# Patient Record
Sex: Male | Born: 1955 | Race: Black or African American | Hispanic: No | Marital: Married | Smoking: Never smoker
Health system: Southern US, Community
[De-identification: ages and names within clinical notes are randomized; demographics above are authoritative.]

## PROBLEM LIST (undated history)

## (undated) DIAGNOSIS — I10 Essential (primary) hypertension: Secondary | ICD-10-CM

## (undated) DIAGNOSIS — R972 Elevated prostate specific antigen [PSA]: Secondary | ICD-10-CM

## (undated) DIAGNOSIS — K219 Gastro-esophageal reflux disease without esophagitis: Secondary | ICD-10-CM

## (undated) DIAGNOSIS — E7800 Pure hypercholesterolemia, unspecified: Secondary | ICD-10-CM

## (undated) HISTORY — PX: BACK SURGERY: SHX140

## (undated) HISTORY — PX: APPENDECTOMY: SHX54

---

## 2011-10-17 ENCOUNTER — Emergency Department (HOSPITAL_COMMUNITY)
Admission: EM | Admit: 2011-10-17 | Discharge: 2011-10-17 | Disposition: A | Discharge status: Home / Self Care | Payer: Managed Care, Other (non HMO) | Attending: Emergency Medicine | Admitting: Emergency Medicine

## 2011-10-17 ENCOUNTER — Emergency Department (HOSPITAL_COMMUNITY): Payer: Managed Care, Other (non HMO)

## 2011-10-17 ENCOUNTER — Encounter: Payer: Self-pay | Admitting: Emergency Medicine

## 2011-10-17 DIAGNOSIS — R791 Abnormal coagulation profile: Secondary | ICD-10-CM | POA: Insufficient documentation

## 2011-10-17 DIAGNOSIS — E7800 Pure hypercholesterolemia, unspecified: Secondary | ICD-10-CM | POA: Insufficient documentation

## 2011-10-17 DIAGNOSIS — R531 Weakness: Secondary | ICD-10-CM

## 2011-10-17 DIAGNOSIS — R0602 Shortness of breath: Secondary | ICD-10-CM | POA: Insufficient documentation

## 2011-10-17 DIAGNOSIS — R059 Cough, unspecified: Secondary | ICD-10-CM | POA: Insufficient documentation

## 2011-10-17 DIAGNOSIS — R05 Cough: Secondary | ICD-10-CM | POA: Insufficient documentation

## 2011-10-17 DIAGNOSIS — I10 Essential (primary) hypertension: Secondary | ICD-10-CM | POA: Insufficient documentation

## 2011-10-17 DIAGNOSIS — R5381 Other malaise: Secondary | ICD-10-CM | POA: Insufficient documentation

## 2011-10-17 DIAGNOSIS — Z79899 Other long term (current) drug therapy: Secondary | ICD-10-CM | POA: Insufficient documentation

## 2011-10-17 DIAGNOSIS — R5383 Other fatigue: Secondary | ICD-10-CM | POA: Insufficient documentation

## 2011-10-17 HISTORY — DX: Essential (primary) hypertension: I10

## 2011-10-17 HISTORY — DX: Pure hypercholesterolemia, unspecified: E78.00

## 2011-10-17 LAB — COMPREHENSIVE METABOLIC PANEL
Albumin: 4 g/dL (ref 3.5–5.2)
BUN: 19 mg/dL (ref 6–23)
Calcium: 9.5 mg/dL (ref 8.4–10.5)
Chloride: 100 mEq/L (ref 96–112)
Creatinine, Ser: 1.2 mg/dL (ref 0.50–1.35)
GFR calc non Af Amer: 67 mL/min — ABNORMAL LOW (ref >90)
Total Bilirubin: 1 mg/dL (ref 0.3–1.2)

## 2011-10-17 LAB — CBC
HCT: 42.9 % (ref 39.0–52.0)
Hemoglobin: 14.6 g/dL (ref 13.0–17.0)
MCH: 30.1 pg (ref 26.0–34.0)
MCHC: 34 g/dL (ref 30.0–36.0)
MCV: 88.5 fL (ref 78.0–100.0)

## 2011-10-17 LAB — RAPID URINE DRUG SCREEN, HOSP PERFORMED
Cocaine: NOT DETECTED
Opiates: NOT DETECTED
Tetrahydrocannabinol: NOT DETECTED

## 2011-10-17 LAB — DIFFERENTIAL
Basophils Relative: 0 % (ref 0–1)
Eosinophils Absolute: 0.1 10*3/uL (ref 0.0–0.7)
Eosinophils Relative: 2 % (ref 0–5)
Monocytes Absolute: 0.4 10*3/uL (ref 0.1–1.0)
Monocytes Relative: 6 % (ref 3–12)
Neutro Abs: 4.7 10*3/uL (ref 1.7–7.7)

## 2011-10-17 LAB — URINALYSIS, ROUTINE W REFLEX MICROSCOPIC
Glucose, UA: NEGATIVE mg/dL
Leukocytes, UA: NEGATIVE
pH: 6.5 (ref 5.0–8.0)

## 2011-10-17 MED ORDER — IOHEXOL 300 MG/ML  SOLN
100.0000 mL | Freq: Once | INTRAMUSCULAR | Status: AC | PRN
  Administered 2011-10-17: 100 mL via INTRAVENOUS

## 2011-10-17 MED ORDER — SODIUM CHLORIDE 0.9 % IV SOLN
999.0000 mL | INTRAVENOUS | Status: DC
  Administered 2011-10-17: 999 mL via INTRAVENOUS

## 2011-10-17 NOTE — ED Notes (Signed)
ZOX:[id number]<AV> Expected date:10/17/11<BR> Expected time: 5:55 AM<BR> Means of arrival:Ambulance<BR> Comments:<BR> EMS general weakness

## 2011-10-17 NOTE — ED Provider Notes (Signed)
History     CSN: [id number] Arrival date & time: 10/17/2011  6:15 AM   First MD Initiated Contact with Patient 10/17/11 [phone]      Chief Complaint  Patient presents with  . Weakness    Weakness this am, slowly resolving    (Consider location/radiation/quality/duration/timing/severity/associated sxs/prior treatment) Patient is a 55 y.o. male presenting with weakness. The history is provided by the patient.  Weakness Primary symptoms do not include headaches, syncope, loss of consciousness, seizures, dizziness, visual change, fever, nausea or vomiting. Episode onset: Several days ago. The symptoms are unchanged. The neurological symptoms are diffuse.  Additional symptoms include weakness. Medical issues do not include seizures or cerebral vascular accident. Associated medical issues comments: He was recently diagnosed with renal failure, treated successfully with IV fluids at a hospital in [place].   This morning, he had a period of rapid heartbeat and in decreased sensation in his right leg. He then checked his blood pressure was 170/110. The right leg dysesthesia resolved spontaneously. No associated chest pain. He has no shortness of breath, while supine. His weaknesses nonspecific in nature and is characterized by a "tired feeling".  Past Medical History  Diagnosis Date  . Hypertension   . High cholesterol     Past Surgical History  Procedure Date  . Back surgery     two back surgeries to help with sciatica    No family history on file.  History  Substance Use Topics  . Smoking status: Not on file  . Smokeless tobacco: Not on file  . Alcohol Use:       Review of Systems  Constitutional: Negative for fever.  Cardiovascular: Negative for syncope.  Gastrointestinal: Negative for nausea and vomiting.  Neurological: Positive for weakness. Negative for dizziness, seizures, loss of consciousness and headaches.  All other systems reviewed and are negative.    Allergies    Review of patient's allergies indicates no known allergies.  Home Medications   Current Outpatient Rx  Name Route Sig Dispense Refill  . DEXTROMETHORPHAN POLISTIREX ER 30 MG/5ML PO LQCR Oral Take 60 mg by mouth as needed.      Marland Kitchen DOXAZOSIN MESYLATE 2 MG PO TABS Oral Take 2 mg by mouth daily.      Marland Kitchen METOPROLOL SUCCINATE ER 200 MG PO TB24 Oral Take 200 mg by mouth daily.      Marland Kitchen SIMVASTATIN 10 MG PO TABS Oral Take 10 mg by mouth at bedtime.      . TRIAMTERENE-HCTZ 75-50 MG PO TABS Oral Take 1 tablet by mouth daily.        BP 143/93  Pulse 71  Temp(Src) 98.7 F (37.1 C) (Oral)  Resp 20  SpO2 99%  Physical Exam  Constitutional: He is oriented to person, place, and time. He appears well-developed and well-nourished.  HENT:  Head: Normocephalic and atraumatic.  Eyes: Conjunctivae and EOM are normal. Pupils are equal, round, and reactive to light.  Neck: Normal range of motion. Neck supple.  Cardiovascular: Normal rate and regular rhythm.   Pulmonary/Chest: Effort normal and breath sounds normal.  Abdominal: Soft. Bowel sounds are normal.  Musculoskeletal: Normal range of motion.  Neurological: He is alert and oriented to person, place, and time. No cranial nerve deficit. He exhibits normal muscle tone. Coordination normal.       There is no altered light touch sensation.  Skin: Skin is warm and dry.  Psychiatric: His behavior is normal. Judgment and thought content normal.  He is anxious    ED Course  Procedures (including critical care time)  Labs Reviewed  COMPREHENSIVE METABOLIC PANEL - Abnormal; Notable for the following:    Glucose, Bld 138 (*)    GFR calc non Af Amer 67 (*)    GFR calc Af Amer 78 (*)    All other components within normal limits  D-DIMER, QUANTITATIVE - Abnormal; Notable for the following:    D-Dimer, Quant 0.87 (*)    All other components within normal limits  AMMONIA  CBC  DIFFERENTIAL  URINE RAPID DRUG SCREEN (HOSP PERFORMED)  ETHANOL   LACTIC ACID, PLASMA  URINALYSIS, ROUTINE W REFLEX MICROSCOPIC  URINE CULTURE   Dg Chest 2 View  10/17/2011  *RADIOLOGY REPORT*  Clinical Data:  congestion, weakness, cough  CHEST - 2 VIEW  Comparison: None.  Findings: Borderline cardiomegaly noted.  No acute infiltrate or pulmonary edema.  Mild degenerative changes thoracic spine. Minimal perihilar increased bronchial markings without focal consolidation.  IMPRESSION: No acute infiltrate or pulmonary edema.  Minimal perihilar increased bronchial markings without focal consolidation.  Mild degenerative changes thoracic spine.  Original Report Authenticated By: Natasha Mead, M.D.   Ct Angio Chest W/cm &/or Wo Cm  10/17/2011  *RADIOLOGY REPORT*  Clinical Data:  Shortness of breath, elevated D-dimer  CT ANGIOGRAPHY CHEST WITH CONTRAST  Technique:  Multidetector CT imaging of the chest was performed using the standard protocol during bolus administration of intravenous contrast.  Multiplanar CT image reconstructions including MIPs were obtained to evaluate the vascular anatomy.  Contrast: OMNIPAQUE IOHEXOL 300 MG/ML IV SOLN  Comparison:   Site of service.  Findings:  Sagittal images of the spine shows degenerative changes mid and lower thoracic spine with multilevel anterior spurring.  Sagittal view of the sternum is unremarkable.  Images of the thoracic inlet are unremarkable.  Central airways are patent.  No mediastinal hematoma or adenopathy.  Heart size is within normal limits.  No pericardial effusion is noted.  No pulmonary embolus is identified.  Images of the lung parenchyma shows no acute infiltrate or pleural effusion.  No pulmonary edema.  The visualized upper abdomen is unremarkable.  Question tiny hiatal hernia.  There is a nodular density in the left upper abdomen subdiaphragmatic measures 8 mm.  This may represent small accessory splenule or a lymph node.  Review of the MIP images confirms the above findings.  IMPRESSION:  1.  No pulmonary  embolus is noted. 2.  No acute infiltrate or pulmonary edema. 3.  No adenopathy. 4. There is 8 mm nodule in the left upper abdomen subdiaphragmatic may represent small accessory splenule or a lymph node.  Original Report Authenticated By: Natasha Mead, M.D.     1. Weakness       MDM  Nonspecific weakness, with negative evaluation in the ED. Doubt pneumonia, stroke, metabolic, or occult infection.        Flint Melter, MD 10/17/11 [phone]

## 2011-10-17 NOTE — ED Notes (Signed)
Pt states that this am he had difficulty waking up, and had felt extremely worn out.  Pt states that he has had a cough for the past few days and is taking OTC cough medicine.  Pt had high blood pressure PTA 170/80

## 2011-10-18 LAB — URINE CULTURE
Colony Count: NO GROWTH
Culture  Setup Time: 201212041123

## 2013-02-16 ENCOUNTER — Emergency Department (HOSPITAL_COMMUNITY)
Admission: EM | Admit: 2013-02-16 | Discharge: 2013-02-16 | Disposition: A | Discharge status: Home / Self Care | Payer: Managed Care, Other (non HMO) | Attending: Emergency Medicine | Admitting: Emergency Medicine

## 2013-02-16 ENCOUNTER — Encounter (HOSPITAL_COMMUNITY): Payer: Self-pay | Admitting: Emergency Medicine

## 2013-02-16 ENCOUNTER — Emergency Department (HOSPITAL_COMMUNITY): Payer: Managed Care, Other (non HMO)

## 2013-02-16 ENCOUNTER — Encounter (HOSPITAL_COMMUNITY)
Admission: EM | Disposition: A | Discharge status: Home / Self Care | Payer: Self-pay | Source: Home / Self Care | Attending: Emergency Medicine

## 2013-02-16 ENCOUNTER — Ambulatory Visit (HOSPITAL_COMMUNITY): Admit: 2013-02-16 | Payer: Self-pay | Admitting: Cardiovascular Disease

## 2013-02-16 DIAGNOSIS — E785 Hyperlipidemia, unspecified: Secondary | ICD-10-CM | POA: Insufficient documentation

## 2013-02-16 DIAGNOSIS — R141 Gas pain: Secondary | ICD-10-CM | POA: Insufficient documentation

## 2013-02-16 DIAGNOSIS — R143 Flatulence: Secondary | ICD-10-CM | POA: Insufficient documentation

## 2013-02-16 DIAGNOSIS — I10 Essential (primary) hypertension: Secondary | ICD-10-CM | POA: Insufficient documentation

## 2013-02-16 DIAGNOSIS — Z79899 Other long term (current) drug therapy: Secondary | ICD-10-CM | POA: Insufficient documentation

## 2013-02-16 DIAGNOSIS — R142 Eructation: Secondary | ICD-10-CM | POA: Insufficient documentation

## 2013-02-16 DIAGNOSIS — K219 Gastro-esophageal reflux disease without esophagitis: Secondary | ICD-10-CM | POA: Insufficient documentation

## 2013-02-16 DIAGNOSIS — R0602 Shortness of breath: Secondary | ICD-10-CM | POA: Insufficient documentation

## 2013-02-16 DIAGNOSIS — Z7982 Long term (current) use of aspirin: Secondary | ICD-10-CM | POA: Insufficient documentation

## 2013-02-16 DIAGNOSIS — K3189 Other diseases of stomach and duodenum: Secondary | ICD-10-CM | POA: Insufficient documentation

## 2013-02-16 DIAGNOSIS — R1013 Epigastric pain: Secondary | ICD-10-CM

## 2013-02-16 DIAGNOSIS — E7800 Pure hypercholesterolemia, unspecified: Secondary | ICD-10-CM | POA: Insufficient documentation

## 2013-02-16 LAB — COMPREHENSIVE METABOLIC PANEL
ALT: 23 U/L (ref 0–53)
AST: 25 U/L (ref 0–37)
AST: 23 U/L (ref 0–37)
Albumin: 4.1 g/dL (ref 3.5–5.2)
Alkaline Phosphatase: 53 U/L (ref 39–117)
CO2: 28 mEq/L (ref 19–32)
CO2: 24 mEq/L (ref 19–32)
Calcium: 9.5 mg/dL (ref 8.4–10.5)
Chloride: 101 mEq/L (ref 96–112)
Creatinine, Ser: 1.35 mg/dL (ref 0.50–1.35)
GFR calc non Af Amer: 58 mL/min — ABNORMAL LOW (ref >90)
GFR calc non Af Amer: 74 mL/min — ABNORMAL LOW (ref >90)
Potassium: 3.5 mEq/L (ref 3.5–5.1)
Sodium: 136 mEq/L (ref 135–145)
Total Bilirubin: 0.8 mg/dL (ref 0.3–1.2)

## 2013-02-16 LAB — CBC WITH DIFFERENTIAL/PLATELET
Basophils Absolute: 0 10*3/uL (ref 0.0–0.1)
Basophils Absolute: 0 10*3/uL (ref 0.0–0.1)
Basophils Relative: 0 % (ref 0–1)
Eosinophils Relative: 2 % (ref 0–5)
HCT: 41.6 % (ref 39.0–52.0)
HCT: 43.4 % (ref 39.0–52.0)
Lymphocytes Relative: 28 % (ref 12–46)
MCHC: 36.1 g/dL — ABNORMAL HIGH (ref 30.0–36.0)
MCV: 86.5 fL (ref 78.0–100.0)
Monocytes Absolute: 0.4 10*3/uL (ref 0.1–1.0)
Monocytes Absolute: 0.3 10*3/uL (ref 0.1–1.0)
Neutro Abs: 3.4 10*3/uL (ref 1.7–7.7)
Neutrophils Relative %: 67 % (ref 43–77)
Platelets: 176 10*3/uL (ref 150–400)
RDW: 13.5 % (ref 11.5–15.5)
RDW: 13.5 % (ref 11.5–15.5)
WBC: 5.1 10*3/uL (ref 4.0–10.5)

## 2013-02-16 SURGERY — LEFT HEART CATHETERIZATION WITH CORONARY ANGIOGRAM
Anesthesia: LOCAL

## 2013-02-16 MED ORDER — OMEPRAZOLE 20 MG PO CPDR: 20.0000 mg | DELAYED_RELEASE_CAPSULE | Freq: Every day | ORAL | Status: DC

## 2013-02-16 MED ORDER — POTASSIUM CHLORIDE CRYS ER 20 MEQ PO TBCR
40.0000 meq | EXTENDED_RELEASE_TABLET | Freq: Once | ORAL | Status: AC
  Administered 2013-02-16: 40 meq via ORAL
  Filled 2013-02-16: qty 2

## 2013-02-16 MED ORDER — METOCLOPRAMIDE HCL 5 MG/ML IJ SOLN
10.0000 mg | Freq: Once | INTRAMUSCULAR | Status: AC
  Administered 2013-02-16: 10 mg via INTRAVENOUS
  Filled 2013-02-16: qty 2

## 2013-02-16 MED ORDER — SIMETHICONE 80 MG PO CHEW: 80.0000 mg | CHEWABLE_TABLET | Freq: Four times a day (QID) | ORAL | Status: DC | PRN

## 2013-02-16 MED ORDER — METOCLOPRAMIDE HCL 10 MG PO TABS: 10.0000 mg | ORAL_TABLET | Freq: Four times a day (QID) | ORAL | Status: DC

## 2013-02-16 MED ORDER — GI COCKTAIL ~~LOC~~
30.0000 mL | Freq: Once | ORAL | Status: AC
  Administered 2013-02-16: 30 mL via ORAL
  Filled 2013-02-16: qty 30

## 2013-02-16 MED ORDER — GI COCKTAIL ~~LOC~~
30.0000 mL | Freq: Once | ORAL | Status: AC
  Administered 2013-02-16: 30 mL via ORAL
  Filled 2013-02-16 (×2): qty 30

## 2013-02-16 MED ORDER — PANTOPRAZOLE SODIUM 40 MG IV SOLR
40.0000 mg | Freq: Once | INTRAVENOUS | Status: AC
  Administered 2013-02-16: 40 mg via INTRAVENOUS
  Filled 2013-02-16: qty 40

## 2013-02-16 NOTE — ED Notes (Signed)
Patient transported to X-ray 

## 2013-02-16 NOTE — ED Notes (Signed)
Patient with belching, hiccups, continuous to point of having shortness of breath.  Patient was given 324mg  ASA by EMS.  EKG by [facility] shows elevation in V2, V3 and V4.  Patient denies any chest pain, no nausea, no vomiting.

## 2013-02-16 NOTE — ED Notes (Signed)
Pt was seen at [facility].  A stemi was called d/t elevation however it was cancelled bc pt is denying chest/sob.  Pt was sent home.  Called to 911 for hiccups because they came back.

## 2013-02-16 NOTE — ED Notes (Signed)
Pt states he has been "belching" off and on for a few days

## 2013-02-16 NOTE — ED Provider Notes (Signed)
History     CSN: [id number]  Arrival date & time 02/16/13  0601   First MD Initiated Contact with Patient 02/16/13 [phone]      Chief Complaint  Patient presents with  . Code STEMI    (Consider location/radiation/quality/duration/timing/severity/associated sxs/prior treatment) HPI 57 year old male presents to emergency room with complaint of burping, causing shortness of breath.  Patient was initially called out as a code STEMI, due to ST elevations in the septal leads.  Patient denies any chest pain.  He does not have shortness of breath when he has not burping excessively.  He reports symptoms started about 4 AM while he was getting ready for work.  Patient, reports he's had intermittent dyspepsia and burping, ongoing for several months, has been put on Prevacid for same.  He reports Prevacid has made symptoms.  A little better.  He has never had burping, to the point of being short of breath before.  No change in his diet.  No family history of coronary disease, sister has hypertension.  Patient reports he had a stress test done within last year, which she was told was normal.  This was done in [place] Past Medical History  Diagnosis Date  . Hypertension   . High cholesterol     Past Surgical History  Procedure Laterality Date  . Back surgery      two back surgeries to help with sciatica    No family history on file.  History  Substance Use Topics  . Smoking status: Not on file  . Smokeless tobacco: Not on file  . Alcohol Use:       Review of Systems  All other systems reviewed and are negative.    Allergies  Caffeine  Home Medications   Current Outpatient Rx  Name  Route  Sig  Dispense  Refill  . aspirin EC 81 MG tablet   Oral   Take 81 mg by mouth daily.         Marland Kitchen doxazosin (CARDURA) 4 MG tablet   Oral   Take 8 mg by mouth at bedtime.         . metoprolol (TOPROL-XL) 200 MG 24 hr tablet   Oral   Take 400 mg by mouth daily.          . simvastatin  (ZOCOR) 10 MG tablet   Oral   Take 10 mg by mouth at bedtime.           . triamterene-hydrochlorothiazide (MAXZIDE-25) 37.5-25 MG per tablet   Oral   Take 1 tablet by mouth daily.           BP 140/86  Pulse 70  Temp(Src) 98.2 F (36.8 C) (Oral)  Resp 21  SpO2 100%  Physical Exam  Nursing note and vitals reviewed. Constitutional: He is oriented to person, place, and time. He appears well-developed and well-nourished. He appears distressed.  Nervous appearing, frequent belching  HENT:  Head: Normocephalic and atraumatic.  Nose: Nose normal.  Mouth/Throat: Oropharynx is clear and moist.  Eyes: Conjunctivae and EOM are normal. Pupils are equal, round, and reactive to light.  Neck: Normal range of motion. Neck supple. No JVD present. No tracheal deviation present. No thyromegaly present.  Cardiovascular: Normal rate, regular rhythm, normal heart sounds and intact distal pulses.  Exam reveals no gallop and no friction rub.   No murmur heard. Pulmonary/Chest: Effort normal and breath sounds normal. No stridor. No respiratory distress. He has no wheezes. He has no rales. He  exhibits no tenderness.  Abdominal: Soft. Bowel sounds are normal. He exhibits no distension and no mass. There is no tenderness. There is no rebound and no guarding.  Musculoskeletal: Normal range of motion. He exhibits no edema and no tenderness.  Lymphadenopathy:    He has no cervical adenopathy.  Neurological: He is alert and oriented to person, place, and time. He exhibits normal muscle tone. Coordination normal.  Skin: Skin is warm and dry. No rash noted. No erythema. No pallor.  Psychiatric: He has a normal mood and affect. His behavior is normal. Judgment and thought content normal.    ED Course  Procedures (including critical care time)  Labs Reviewed  CBC WITH DIFFERENTIAL - Abnormal; Notable for the following:    MCHC 36.1 (*)    Platelets 140 (*)    All other components within normal limits   COMPREHENSIVE METABOLIC PANEL - Abnormal; Notable for the following:    Potassium 3.3 (*)    Glucose, Bld 131 (*)    GFR calc non Af Amer 58 (*)    GFR calc Af Amer 67 (*)    All other components within normal limits  TROPONIN I   Dg Chest 2 View  02/16/2013  *RADIOLOGY REPORT*  Clinical Data: Chest pain  CHEST - 2 VIEW  Comparison: 10/17/2011  Findings: No evidence of edema, infiltrate, nodule or pleural fluid.  Heart size is normal.  Stable tortuosity the thoracic aorta and degenerative changes of the thoracic spine.  IMPRESSION: No active disease.   Original Report Authenticated By: Irish Lack, M.D.      1. GERD (gastroesophageal reflux disease)   2. Dyspepsia   3. Belchings       MDM  57 year old male with burping.  Repeat EKG shows ST elevation in V2, V3, V4.  This is most likely consistent with early repolarization, there is are no reciprocal changes.  This was discussed with the on-call cardiologist, Dr Kirke Corin, who agrees.  Will treat with Gi cocktail, check labs.  8:10 AM Pt feeling better after medications.  No further sob, only mild belching.  Will d/c on reglan, simethicone and f/u with local PCM        Olivia Mackie, MD 02/16/13 [phone]

## 2013-02-16 NOTE — ED Notes (Signed)
Pt moved to Room 31. Pt less anxious. States the medicine has helped his "belching"

## 2013-02-16 NOTE — ED Provider Notes (Signed)
History     CSN: [id number]  Arrival date & time 02/16/13  1112   First MD Initiated Contact with Patient 02/16/13 1147      Chief Complaint  Patient presents with  . Hiccups    (Consider location/radiation/quality/duration/timing/severity/associated sxs/prior treatment) HPI Comments: Patient presents via EMS with belching causing shortness of breath. He states he's had this problem for the past 5 months but worse in the past day. He was seen at [facility] earlier today and was initially called a code STEMI which was canceled as he is not having chest pain. His EKG seems to be consistent with early repolarization and there are no reciprocal changes. Patient denies any chest pain. He said in her mild dyspepsia burping for several months and has been on Prevacid. He's never seen a gastroenterologist. He had negative stress test 2 months ago in [place]. These are the same symptoms he had this morning when he was seen at [facility].  The history is provided by the patient.    Past Medical History  Diagnosis Date  . Hypertension   . High cholesterol     Past Surgical History  Procedure Laterality Date  . Back surgery      two back surgeries to help with sciatica    No family history on file.  History  Substance Use Topics  . Smoking status: Not on file  . Smokeless tobacco: Not on file  . Alcohol Use:       Review of Systems  Constitutional: Negative for fever, activity change and appetite change.  HENT: Negative for congestion and rhinorrhea.   Respiratory: Positive for shortness of breath. Negative for cough and chest tightness.   Cardiovascular: Negative for chest pain.  Gastrointestinal: Negative for nausea, vomiting and abdominal pain.  Genitourinary: Negative for dysuria and hematuria.  Musculoskeletal: Negative for back pain.  Skin: Negative for rash.  Neurological: Negative for dizziness, weakness and headaches.  A complete 10 system review of systems was obtained and all  systems are negative except as noted in the HPI and PMH.    Allergies  Caffeine  Home Medications   Current Outpatient Rx  Name  Route  Sig  Dispense  Refill  . aspirin EC 81 MG tablet   Oral   Take 81 mg by mouth daily.         Marland Kitchen doxazosin (CARDURA) 4 MG tablet   Oral   Take 8 mg by mouth at bedtime.         . metoCLOPramide (REGLAN) 10 MG tablet   Oral   Take 1 tablet (10 mg total) by mouth every 6 (six) hours.   30 tablet   0   . metoprolol (TOPROL-XL) 200 MG 24 hr tablet   Oral   Take 400 mg by mouth daily.          . simethicone (GAS-X) 80 MG chewable tablet   Oral   Chew 1 tablet (80 mg total) by mouth every 6 (six) hours as needed (belching).   30 tablet   0   . simvastatin (ZOCOR) 10 MG tablet   Oral   Take 10 mg by mouth at bedtime.           . triamterene-hydrochlorothiazide (MAXZIDE-25) 37.5-25 MG per tablet   Oral   Take 1 tablet by mouth daily.         Marland Kitchen omeprazole (PRILOSEC) 20 MG capsule   Oral   Take 1 capsule (20 mg total) by mouth daily.  30 capsule   0     BP 154/92  Pulse 76  Temp(Src) 98.9 F (37.2 C) (Oral)  Resp 20  SpO2 99%  Physical Exam  Constitutional: He is oriented to person, place, and time. He appears well-developed and well-nourished. No distress.  Anxious, belching  HENT:  Head: Normocephalic and atraumatic.  Mouth/Throat: Oropharynx is clear and moist. No oropharyngeal exudate.  Eyes: Conjunctivae and EOM are normal. Pupils are equal, round, and reactive to light.  Neck: Normal range of motion. Neck supple.  Cardiovascular: Normal rate, regular rhythm and normal heart sounds.   No murmur heard. Pulmonary/Chest: Effort normal and breath sounds normal. No respiratory distress.  Abdominal: Soft. There is no tenderness. There is no rebound and no guarding.  Musculoskeletal: Normal range of motion. He exhibits no edema and no tenderness.  Neurological: He is alert and oriented to person, place, and time. No  cranial nerve deficit. He exhibits normal muscle tone. Coordination normal.  Skin: Skin is warm.    ED Course  Procedures (including critical care time)  Labs Reviewed  COMPREHENSIVE METABOLIC PANEL - Abnormal; Notable for the following:    Glucose, Bld 112 (*)    GFR calc non Af Amer 74 (*)    GFR calc Af Amer 86 (*)    All other components within normal limits  CBC WITH DIFFERENTIAL  TROPONIN I  D-DIMER, QUANTITATIVE  TROPONIN I   Dg Chest 2 View  02/16/2013  *RADIOLOGY REPORT*  Clinical Data: Shortness of breath, chest pain.  CHEST - 2 VIEW  Comparison: 02/16/2013  Findings: Heart size upper limits normal, stable.  Lungs clear.  No effusion.  Minimal spurring in the mid thoracic spine.  Mildly tortuous thoracic aorta.  IMPRESSION:  No acute disease   Original Report Authenticated By: D. Andria Rhein, MD    Dg Chest 2 View  02/16/2013  *RADIOLOGY REPORT*  Clinical Data: Chest pain  CHEST - 2 VIEW  Comparison: 10/17/2011  Findings: No evidence of edema, infiltrate, nodule or pleural fluid.  Heart size is normal.  Stable tortuosity the thoracic aorta and degenerative changes of the thoracic spine.  IMPRESSION: No active disease.   Original Report Authenticated By: Irish Lack, M.D.      1. Belching   2. Dyspepsia       MDM  Belching with abnormal EKG. Seen earlier today for the same. EKG unchanged from previous.  Chest x-ray negative. D-dimer negative. Doubt ACS or PE. No chest pain or shortness of breath. Symptom control for belching and dyspepsia. Follow up with GI. Continue Reglan, simethicone, Prilosec.   Date: 02/16/2013  Rate: 66  Rhythm: normal sinus rhythm  QRS Axis: normal  Intervals: normal  ST/T Wave abnormalities: ST elevations anteriorly  Conduction Disutrbances:none  Narrative Interpretation: ST elevation in V2, V3, V4, no reciprocal changes Similar to previous  Old EKG Reviewed: unchanged         Glynn Octave, MD 02/16/13 1550

## 2013-02-16 NOTE — ED Notes (Signed)
Pt states he"was at [facility] hospital yesterday because he has been hiccuping for almost 2 months however, it got worst and hasn't been able to eat because it felt like his food was getting stuck and coming back up and he was tired from not being able to cope with the hiccups. States he felt better after d/c however, he feels like something just isnt right with his body. States he has been unable to take bp meds etc bc he is scared he will choke r/t hiccups". Continues to stated "please tell me everything is going to be ok and that I am ok".

## 2013-02-16 NOTE — ED Notes (Signed)
Code stemi canceled by Dr. Norlene Campbell

## 2013-02-17 ENCOUNTER — Other Ambulatory Visit: Payer: Self-pay

## 2013-04-21 ENCOUNTER — Encounter (HOSPITAL_BASED_OUTPATIENT_CLINIC_OR_DEPARTMENT_OTHER): Payer: Self-pay

## 2013-04-21 ENCOUNTER — Emergency Department (HOSPITAL_BASED_OUTPATIENT_CLINIC_OR_DEPARTMENT_OTHER)
Admission: EM | Admit: 2013-04-21 | Discharge: 2013-04-22 | Disposition: A | Discharge status: Home / Self Care | Payer: Managed Care, Other (non HMO) | Attending: Emergency Medicine | Admitting: Emergency Medicine

## 2013-04-21 ENCOUNTER — Other Ambulatory Visit: Payer: Self-pay

## 2013-04-21 ENCOUNTER — Emergency Department (HOSPITAL_BASED_OUTPATIENT_CLINIC_OR_DEPARTMENT_OTHER)
Admission: EM | Admit: 2013-04-21 | Discharge: 2013-04-21 | Disposition: A | Discharge status: Home / Self Care | Payer: Managed Care, Other (non HMO) | Attending: Emergency Medicine | Admitting: Emergency Medicine

## 2013-04-21 DIAGNOSIS — K560 Paralytic ileus: Secondary | ICD-10-CM | POA: Insufficient documentation

## 2013-04-21 DIAGNOSIS — Z7982 Long term (current) use of aspirin: Secondary | ICD-10-CM | POA: Insufficient documentation

## 2013-04-21 DIAGNOSIS — R110 Nausea: Secondary | ICD-10-CM | POA: Insufficient documentation

## 2013-04-21 DIAGNOSIS — R142 Eructation: Secondary | ICD-10-CM | POA: Insufficient documentation

## 2013-04-21 DIAGNOSIS — R6889 Other general symptoms and signs: Secondary | ICD-10-CM | POA: Insufficient documentation

## 2013-04-21 DIAGNOSIS — I10 Essential (primary) hypertension: Secondary | ICD-10-CM | POA: Insufficient documentation

## 2013-04-21 DIAGNOSIS — Z79899 Other long term (current) drug therapy: Secondary | ICD-10-CM | POA: Insufficient documentation

## 2013-04-21 DIAGNOSIS — E7800 Pure hypercholesterolemia, unspecified: Secondary | ICD-10-CM | POA: Insufficient documentation

## 2013-04-21 DIAGNOSIS — K2100 Gastro-esophageal reflux disease with esophagitis, without bleeding: Secondary | ICD-10-CM | POA: Insufficient documentation

## 2013-04-21 DIAGNOSIS — R109 Unspecified abdominal pain: Secondary | ICD-10-CM | POA: Insufficient documentation

## 2013-04-21 DIAGNOSIS — K567 Ileus, unspecified: Secondary | ICD-10-CM

## 2013-04-21 DIAGNOSIS — R141 Gas pain: Secondary | ICD-10-CM | POA: Insufficient documentation

## 2013-04-21 HISTORY — DX: Gastro-esophageal reflux disease without esophagitis: K21.9

## 2013-04-21 LAB — CBC WITH DIFFERENTIAL/PLATELET
Basophils Relative: 0 % (ref 0–1)
Eosinophils Absolute: 0.1 10*3/uL (ref 0.0–0.7)
Eosinophils Relative: 2 % (ref 0–5)
HCT: 40.5 % (ref 39.0–52.0)
Hemoglobin: 14.5 g/dL (ref 13.0–17.0)
Lymphocytes Relative: 35 % (ref 12–46)
MCH: 31.3 pg (ref 26.0–34.0)
MCHC: 35.8 g/dL (ref 30.0–36.0)
Monocytes Absolute: 0.4 10*3/uL (ref 0.1–1.0)
Neutro Abs: 2.1 10*3/uL (ref 1.7–7.7)
RBC: 4.63 MIL/uL (ref 4.22–5.81)

## 2013-04-21 LAB — COMPREHENSIVE METABOLIC PANEL
ALT: 36 U/L (ref 0–53)
Alkaline Phosphatase: 55 U/L (ref 39–117)
BUN: 12 mg/dL (ref 6–23)
CO2: 23 mEq/L (ref 19–32)
GFR calc Af Amer: 85 mL/min — ABNORMAL LOW (ref >90)
GFR calc non Af Amer: 73 mL/min — ABNORMAL LOW (ref >90)
Glucose, Bld: 107 mg/dL — ABNORMAL HIGH (ref 70–99)
Potassium: 3.3 mEq/L — ABNORMAL LOW (ref 3.5–5.1)
Total Protein: 7.3 g/dL (ref 6.0–8.3)

## 2013-04-21 LAB — LIPASE, BLOOD: Lipase: 60 U/L — ABNORMAL HIGH (ref 11–59)

## 2013-04-21 MED ORDER — PANTOPRAZOLE SODIUM 40 MG IV SOLR
40.0000 mg | Freq: Once | INTRAVENOUS | Status: AC
  Administered 2013-04-21: 40 mg via INTRAVENOUS
  Filled 2013-04-21: qty 40

## 2013-04-21 MED ORDER — PANTOPRAZOLE SODIUM 20 MG PO TBEC: 40.0000 mg | DELAYED_RELEASE_TABLET | Freq: Every day | ORAL | Status: DC

## 2013-04-21 MED ORDER — SUCRALFATE 1 GM/10ML PO SUSP: 1.0000 g | Freq: Four times a day (QID) | ORAL | Status: DC

## 2013-04-21 MED ORDER — GI COCKTAIL ~~LOC~~
30.0000 mL | Freq: Once | ORAL | Status: AC
  Administered 2013-04-21: 30 mL via ORAL
  Filled 2013-04-21: qty 30

## 2013-04-21 NOTE — ED Provider Notes (Signed)
History    This chart was scribed for Joel Lyons, MD by Donne Anon, ED Scribe. This patient was seen in room MH04/MH04 and the patient's care was started at 2340.   CSN: [id number]  Arrival date & time 04/21/13  2326   First MD Initiated Contact with Patient 04/21/13 2340      Chief Complaint  Patient presents with  . Gastrophageal Reflux     The history is provided by the patient. No language interpreter was used.   HPI Comments: [name] is a 57 y.o. male who presents to the Emergency Department complaining of gastrophangeal reflux with associated constant burping. He states occasionally his throat feels tight and he has difficulty swallowing. He states he can swallow food but it gives him a choking sensation, which has caused him to decrease his food intake. He was seen in the ED earlier tonight, and reports that he felt better after he received a gi cocktail and protonix. The symptoms returned as he was getting ready for bed. He states he has had this problem since February 2014 and has been seen in the ED several times for the same complaint. He has seen a gastroenterologist in [place] for this complaint. He regularly takes 20 mg protonix which provide mild relief. He has tried apple cider vinegar which made the symptoms worse. He denies fever, abdominal pain, heartburn, difficulty breathing or any other pain. He denies a history of abdominal surgery.  Past Medical History  Diagnosis Date  . Hypertension   . High cholesterol   . GERD (gastroesophageal reflux disease)     Past Surgical History  Procedure Laterality Date  . Back surgery      two back surgeries to help with sciatica    No family history on file.  History  Substance Use Topics  . Smoking status: Not on file  . Smokeless tobacco: Not on file  . Alcohol Use: Not on file      Review of Systems A complete 10 system review of systems was obtained and all systems are negative except as noted in the HPI  and PMH.   Allergies  Caffeine and Famotidine  Home Medications   Current Outpatient Rx  Name  Route  Sig  Dispense  Refill  . aspirin EC 81 MG tablet   Oral   Take 81 mg by mouth daily.         Marland Kitchen doxazosin (CARDURA) 4 MG tablet   Oral   Take 2 mg by mouth at bedtime.          . metoCLOPramide (REGLAN) 10 MG tablet   Oral   Take 1 tablet (10 mg total) by mouth every 6 (six) hours.   30 tablet   0   . metoprolol (TOPROL-XL) 200 MG 24 hr tablet   Oral   Take 100 mg by mouth 2 (two) times daily.          Marland Kitchen omeprazole (PRILOSEC) 20 MG capsule   Oral   Take 1 capsule (20 mg total) by mouth daily.   30 capsule   0   . pantoprazole (PROTONIX) 20 MG tablet   Oral   Take 2 tablets (40 mg total) by mouth daily.   30 tablet   1   . simethicone (GAS-X) 80 MG chewable tablet   Oral   Chew 1 tablet (80 mg total) by mouth every 6 (six) hours as needed (belching).   30 tablet   0   .  simvastatin (ZOCOR) 10 MG tablet   Oral   Take 10 mg by mouth at bedtime.           . sucralfate (CARAFATE) 1 GM/10ML suspension   Oral   Take 10 mLs (1 g total) by mouth 4 (four) times daily.   420 mL   0   . triamterene-hydrochlorothiazide (MAXZIDE-25) 37.5-25 MG per tablet   Oral   Take 1 tablet by mouth daily.           BP 165/136  Pulse 78  Temp(Src) 98.4 F (36.9 C) (Oral)  Resp 12  SpO2 100%  Physical Exam  Nursing note and vitals reviewed. Constitutional: He is oriented to person, place, and time. He appears well-developed and well-nourished. No distress.  HENT:  Head: Normocephalic and atraumatic.  Mouth/Throat: Oropharynx is clear and moist.  Eyes: EOM are normal.  Neck: Neck supple. No tracheal deviation present.  Cardiovascular: Normal rate, regular rhythm and normal heart sounds.   No murmur heard. Pulmonary/Chest: Effort normal and breath sounds normal. No respiratory distress.  Pt is in no respiratory distress. There is no stridor. He has frequent  episodes of belching which seem to subside while he is speaking, but resume when he stops speaking.   Abdominal: Soft. Bowel sounds are normal. He exhibits no distension. There is no tenderness. There is no rebound and no guarding.  Musculoskeletal: Normal range of motion.  Neurological: He is alert and oriented to person, place, and time.  Skin: Skin is warm and dry.  Psychiatric: He has a normal mood and affect. His behavior is normal.    ED Course  Procedures (including critical care time) DIAGNOSTIC STUDIES: Oxygen Saturation is 100% on RA, normal by my interpretation.    COORDINATION OF CARE: 11:41 PM Discussed treatment plan which includes gui cocktail and protonix with pt at bedside and pt agreed to plan.     Labs Reviewed - No data to display No results found.   No diagnosis found.    MDM  The xray shows an apparent ileus but no obstruction.  He feels better with GI cocktail.  Will discharge with same, add simethicone    I personally performed the services described in this documentation, which was scribed in my presence. The recorded information has been reviewed and is accurate.             Joel Lyons, MD 04/22/13 [phone]

## 2013-04-21 NOTE — ED Provider Notes (Addendum)
History     CSN: [id number]  Arrival date & time 04/21/13  1729   First MD Initiated Contact with Patient 04/21/13 1744      Chief Complaint  Patient presents with  . Gastrophageal Reflux    (Consider location/radiation/quality/duration/timing/severity/associated sxs/prior treatment) Patient is a 57 y.o. male presenting with abdominal pain. The history is provided by the patient. No language interpreter was used.  Abdominal Pain This is a new problem. The current episode started today. The problem occurs constantly. Associated symptoms include abdominal pain and nausea. He has tried nothing for the symptoms. The treatment provided moderate relief.    Past Medical History  Diagnosis Date  . Hypertension   . High cholesterol   . GERD (gastroesophageal reflux disease)     Past Surgical History  Procedure Laterality Date  . Back surgery      two back surgeries to help with sciatica    No family history on file.  History  Substance Use Topics  . Smoking status: Not on file  . Smokeless tobacco: Not on file  . Alcohol Use: Not on file      Review of Systems  Gastrointestinal: Positive for nausea and abdominal pain.  All other systems reviewed and are negative.    Allergies  Caffeine and Famotidine  Home Medications   Current Outpatient Rx  Name  Route  Sig  Dispense  Refill  . aspirin EC 81 MG tablet   Oral   Take 81 mg by mouth daily.         Marland Kitchen doxazosin (CARDURA) 4 MG tablet   Oral   Take 2 mg by mouth at bedtime.          . metoCLOPramide (REGLAN) 10 MG tablet   Oral   Take 1 tablet (10 mg total) by mouth every 6 (six) hours.   30 tablet   0   . metoprolol (TOPROL-XL) 200 MG 24 hr tablet   Oral   Take 100 mg by mouth 2 (two) times daily.          Marland Kitchen omeprazole (PRILOSEC) 20 MG capsule   Oral   Take 1 capsule (20 mg total) by mouth daily.   30 capsule   0   . simethicone (GAS-X) 80 MG chewable tablet   Oral   Chew 1 tablet (80 mg  total) by mouth every 6 (six) hours as needed (belching).   30 tablet   0   . simvastatin (ZOCOR) 10 MG tablet   Oral   Take 10 mg by mouth at bedtime.           . triamterene-hydrochlorothiazide (MAXZIDE-25) 37.5-25 MG per tablet   Oral   Take 1 tablet by mouth daily.           There were no vitals taken for this visit.  Physical Exam  Nursing note and vitals reviewed. Constitutional: He is oriented to person, place, and time. He appears well-developed and well-nourished.  HENT:  Head: Normocephalic and atraumatic.  Eyes: Conjunctivae are normal. Pupils are equal, round, and reactive to light.  Neck: Normal range of motion. Neck supple.  Cardiovascular: Normal rate and normal heart sounds.   Pulmonary/Chest: Effort normal and breath sounds normal.  Abdominal: Soft.  Musculoskeletal: Normal range of motion.  Neurological: He is alert and oriented to person, place, and time. He has normal reflexes.  Skin: Skin is warm.  Psychiatric: He has a normal mood and affect.    ED Course  Procedures (including critical care time)  Labs Reviewed - No data to display No results found.   1. Reflux esophagitis      Date: 04/21/2013  Rate: 67  Rhythm: normal sinus rhythm  QRS Axis: normal  Intervals: normal  ST/T Wave abnormalities: nonspecific ST changes  Conduction Disutrbances:none  Narrative Interpretation:   Old EKG Reviewed: none available   MDM   Results for orders placed during the hospital encounter of 04/21/13  CBC WITH DIFFERENTIAL      Result Value Range   WBC 4.0  4.0 - 10.5 K/uL   RBC 4.63  4.22 - 5.81 MIL/uL   Hemoglobin 14.5  13.0 - 17.0 g/dL   HCT 45.4  09.8 - 11.9 %   MCV 87.5  78.0 - 100.0 fL   MCH 31.3  26.0 - 34.0 pg   MCHC 35.8  30.0 - 36.0 g/dL   RDW 14.7  82.9 - 56.2 %   Platelets 115 (*) 150 - 400 K/uL   Neutrophils Relative % 54  43 - 77 %   Lymphocytes Relative 35  12 - 46 %   Monocytes Relative 9  3 - 12 %   Eosinophils Relative 2   0 - 5 %   Basophils Relative 0  0 - 1 %   Neutro Abs 2.1  1.7 - 7.7 K/uL   Lymphs Abs 1.4  0.7 - 4.0 K/uL   Monocytes Absolute 0.4  0.1 - 1.0 K/uL   Eosinophils Absolute 0.1  0.0 - 0.7 K/uL   Basophils Absolute 0.0  0.0 - 0.1 K/uL   Smear Review LARGE PLATELETS PRESENT    COMPREHENSIVE METABOLIC PANEL      Result Value Range   Sodium 138  135 - 145 mEq/L   Potassium 3.3 (*) 3.5 - 5.1 mEq/L   Chloride 104  96 - 112 mEq/L   CO2 23  19 - 32 mEq/L   Glucose, Bld 107 (*) 70 - 99 mg/dL   BUN 12  6 - 23 mg/dL   Creatinine, Ser 1.30  0.50 - 1.35 mg/dL   Calcium 9.8  8.4 - 86.5 mg/dL   Total Protein 7.3  6.0 - 8.3 g/dL   Albumin 4.1  3.5 - 5.2 g/dL   AST 31  0 - 37 U/L   ALT 36  0 - 53 U/L   Alkaline Phosphatase 55  39 - 117 U/L   Total Bilirubin 1.3 (*) 0.3 - 1.2 mg/dL   GFR calc non Af Amer 73 (*) >90 mL/min   GFR calc Af Amer 85 (*) >90 mL/min  LIPASE, BLOOD      Result Value Range   Lipase 60 (*) 11 - 59 U/L  TROPONIN I      Result Value Range   Troponin I <0.30  <0.30 ng/mL   No results found.   Pt reports he feels better after gi cocktail and  protonix   Pt given referral for primary care.        Elson Areas, PA-C 04/21/13 2118  Elson Areas, PA-C 04/21/13 2119  Elson Areas, PA-C 04/21/13 2244

## 2013-04-21 NOTE — ED Notes (Signed)
Pt. Back to ED with reports of hiccups and burping.  Pt. Reports he feels like he is choking.  Pt. Just left ED approx. 2hrs ago. Mild relief of symptoms earlier and reports he began to have worsening symptoms after getting home. Pt. Actively burping and hiccups noted.  Pt. In no resp. Distress with Resp. Therapy listening to Pt. Breath snds and sitting at the bedside.

## 2013-04-21 NOTE — ED Notes (Signed)
Pt. Reports this burping and indigestion has been going on for about 3 mths.  Pt. Was placed on protonix for this and today he drank apple cider venigar.

## 2013-04-21 NOTE — ED Provider Notes (Signed)
Medical screening examination/treatment/procedure(s) were performed by non-physician practitioner and as supervising physician I was immediately available for consultation/collaboration.  Trianna Lupien M Braydin Aloi, MD 04/21/13 2124 

## 2013-04-21 NOTE — ED Notes (Signed)
Pt reports epigastric pain x 30 minutes.  He drank apple cider vinegar without relief.

## 2013-04-22 ENCOUNTER — Emergency Department (HOSPITAL_BASED_OUTPATIENT_CLINIC_OR_DEPARTMENT_OTHER): Payer: Managed Care, Other (non HMO)

## 2013-04-22 MED ORDER — SIMETHICONE 125 MG PO TABS: 1.0000 | ORAL_TABLET | Freq: Four times a day (QID) | ORAL | Status: DC | PRN

## 2013-04-22 MED ORDER — GI COCKTAIL ~~LOC~~: 30.0000 mL | Freq: Three times a day (TID) | ORAL | Status: DC

## 2013-04-22 NOTE — ED Provider Notes (Signed)
Medical screening examination/treatment/procedure(s) were performed by non-physician practitioner and as supervising physician I was immediately available for consultation/collaboration.  Hurman Horn, MD 04/22/13 [phone]

## 2013-04-22 NOTE — ED Notes (Signed)
Patient transported to X-ray 

## 2013-07-04 ENCOUNTER — Encounter (HOSPITAL_BASED_OUTPATIENT_CLINIC_OR_DEPARTMENT_OTHER): Payer: Self-pay | Admitting: Emergency Medicine

## 2013-07-04 ENCOUNTER — Emergency Department (HOSPITAL_BASED_OUTPATIENT_CLINIC_OR_DEPARTMENT_OTHER)
Admission: EM | Admit: 2013-07-04 | Discharge: 2013-07-04 | Disposition: A | Discharge status: Home / Self Care | Payer: Managed Care, Other (non HMO) | Attending: Emergency Medicine | Admitting: Emergency Medicine

## 2013-07-04 DIAGNOSIS — Z7982 Long term (current) use of aspirin: Secondary | ICD-10-CM | POA: Insufficient documentation

## 2013-07-04 DIAGNOSIS — E7800 Pure hypercholesterolemia, unspecified: Secondary | ICD-10-CM | POA: Insufficient documentation

## 2013-07-04 DIAGNOSIS — K219 Gastro-esophageal reflux disease without esophagitis: Secondary | ICD-10-CM | POA: Insufficient documentation

## 2013-07-04 DIAGNOSIS — Z79899 Other long term (current) drug therapy: Secondary | ICD-10-CM | POA: Insufficient documentation

## 2013-07-04 DIAGNOSIS — J3489 Other specified disorders of nose and nasal sinuses: Secondary | ICD-10-CM

## 2013-07-04 DIAGNOSIS — I10 Essential (primary) hypertension: Secondary | ICD-10-CM | POA: Insufficient documentation

## 2013-07-04 MED ORDER — LORATADINE 10 MG PO TABS: 10.0000 mg | ORAL_TABLET | Freq: Every day | ORAL | Status: DC

## 2013-07-04 NOTE — ED Notes (Signed)
MD at bedside. 

## 2013-07-04 NOTE — ED Notes (Signed)
Pt having sx of reflux for past few days.  This afternoon he started feeling like his throat was swelling.  Has had this happen multiple times in the past.  Has gotten worse tonight.  Sts he was on the way here and he got scared and called EMs.  They met him on the road and evaluated him and "said I was OK to drive myself to the ED".  No resp difficulty noted in triage.  Pt belching frequently.

## 2013-07-04 NOTE — ED Provider Notes (Signed)
CSN: [id number]     Arrival date & time 07/04/13  2251 History     First MD Initiated Contact with Patient 07/04/13 2326     Chief Complaint  Patient presents with  . Oral Swelling   (Consider location/radiation/quality/duration/timing/severity/associated sxs/prior Treatment) HPI Comments: Pt has had problems with burping and intermittent sensation of throat tightness for many years, occurred first as young adult in [place] many years ago.  Has occurred while in [place] a few times, a few times more in [place].  He was worked up by GI and recently by ENT although no results from last exam in [place].  For past 3 days, has felt similar with burping, but no pain, no burning sensation, no CP or abd pain, SOB.  He may have allergies, but no overt sinus pressure, will have occasional nasal discharge and congestion.  Pt has no symptoms currently.  He has tried his reflux mediation tonight with no sig improvement.  No cough, fevers.    The history is provided by the patient.    Past Medical History  Diagnosis Date  . Hypertension   . High cholesterol   . GERD (gastroesophageal reflux disease)    Past Surgical History  Procedure Laterality Date  . Back surgery      two back surgeries to help with sciatica   History reviewed. No pertinent family history. History  Substance Use Topics  . Smoking status: Never Smoker   . Smokeless tobacco: Not on file  . Alcohol Use: Yes     Comment: rarely    Review of Systems  Constitutional: Negative for fever and chills.  HENT: Positive for congestion. Negative for sore throat, voice change and sinus pressure.   Respiratory: Negative for chest tightness and shortness of breath.   Cardiovascular: Negative for chest pain and palpitations.  Gastrointestinal: Negative for nausea and abdominal pain.  Skin: Negative for rash.  All other systems reviewed and are negative.    Allergies  Caffeine and Famotidine  Home Medications   Current Outpatient Rx  Name   Route  Sig  Dispense  Refill  . aspirin EC 81 MG tablet   Oral   Take 81 mg by mouth daily.         Marland Kitchen doxazosin (CARDURA) 4 MG tablet   Oral   Take 2 mg by mouth at bedtime.          . metoprolol (TOPROL-XL) 200 MG 24 hr tablet   Oral   Take 100 mg by mouth daily.          . simvastatin (ZOCOR) 10 MG tablet   Oral   Take 10 mg by mouth at bedtime.           . triamterene-hydrochlorothiazide (MAXZIDE-25) 37.5-25 MG per tablet   Oral   Take 1 tablet by mouth daily.         . Alum & Mag Hydroxide-Simeth (GI COCKTAIL) SUSP suspension   Oral   Take 30 mLs by mouth 3 (three) times daily. Shake well.  Take on an as needed basis   150 mL   1   . loratadine (CLARITIN) 10 MG tablet   Oral   Take 1 tablet (10 mg total) by mouth daily.   30 tablet   0   . metoCLOPramide (REGLAN) 10 MG tablet   Oral   Take 1 tablet (10 mg total) by mouth every 6 (six) hours.   30 tablet   0   . omeprazole (  PRILOSEC) 20 MG capsule   Oral   Take 1 capsule (20 mg total) by mouth daily.   30 capsule   0   . pantoprazole (PROTONIX) 20 MG tablet   Oral   Take 2 tablets (40 mg total) by mouth daily.   30 tablet   1   . simethicone (GAS-X) 80 MG chewable tablet   Oral   Chew 1 tablet (80 mg total) by mouth every 6 (six) hours as needed (belching).   30 tablet   0   . Simethicone 125 MG TABS   Oral   Take 1 tablet (125 mg total) by mouth 4 (four) times daily as needed.   90 tablet   1   . sucralfate (CARAFATE) 1 GM/10ML suspension   Oral   Take 10 mLs (1 g total) by mouth 4 (four) times daily.   420 mL   0    BP 137/92  Pulse 66  Temp(Src) 99.3 F (37.4 C) (Oral)  Resp 16  Ht 5\' 10"  (1.778 m)  Wt 165 lb (74.844 kg)  BMI 23.68 kg/m2  SpO2 99% Physical Exam  Nursing note and vitals reviewed. Constitutional: He is oriented to person, place, and time. He appears well-developed and well-nourished. No distress.  HENT:  Head: Normocephalic and atraumatic.  Nose: No  mucosal edema or rhinorrhea. Right sinus exhibits no maxillary sinus tenderness and no frontal sinus tenderness. Left sinus exhibits no maxillary sinus tenderness and no frontal sinus tenderness.  Mouth/Throat: Uvula is midline, oropharynx is clear and moist and mucous membranes are normal.  Eyes: Conjunctivae and EOM are normal. No scleral icterus.  Neck: Normal range of motion. Neck supple.  Cardiovascular: Normal rate, regular rhythm and intact distal pulses.   Pulmonary/Chest: Effort normal. No respiratory distress. He has no wheezes. He has no rales.  Abdominal: Soft.  Neurological: He is alert and oriented to person, place, and time. He exhibits normal muscle tone. Coordination normal.  Skin: Skin is warm. No rash noted. He is not diaphoretic.  Psychiatric: He has a normal mood and affect.    ED Course   Procedures (including critical care time)  Labs Reviewed - No data to display No results found. 1. Sinus drainage     MDM  Pt is well appearing.  O2 sats are 99% and normal.  Pt is on reflux meds already,  He ahs been seen by GI and ENT recently.  Will add claritin to remove nasal congestion and post nasal drip as cause of symptoms.  Otherwise he is stable for continued outpt follow up  Gavin Pound. Oletta Lamas, MD 07/05/13 1610

## 2015-01-29 ENCOUNTER — Emergency Department (HOSPITAL_BASED_OUTPATIENT_CLINIC_OR_DEPARTMENT_OTHER)
Admission: EM | Admit: 2015-01-29 | Discharge: 2015-01-29 | Disposition: A | Discharge status: Home / Self Care | Payer: Managed Care, Other (non HMO) | Attending: Emergency Medicine | Admitting: Emergency Medicine

## 2015-01-29 ENCOUNTER — Encounter (HOSPITAL_BASED_OUTPATIENT_CLINIC_OR_DEPARTMENT_OTHER): Payer: Self-pay | Admitting: Emergency Medicine

## 2015-01-29 ENCOUNTER — Emergency Department (HOSPITAL_BASED_OUTPATIENT_CLINIC_OR_DEPARTMENT_OTHER): Payer: Managed Care, Other (non HMO)

## 2015-01-29 DIAGNOSIS — I10 Essential (primary) hypertension: Secondary | ICD-10-CM | POA: Diagnosis not present

## 2015-01-29 DIAGNOSIS — E780 Pure hypercholesterolemia: Secondary | ICD-10-CM | POA: Insufficient documentation

## 2015-01-29 DIAGNOSIS — Z7982 Long term (current) use of aspirin: Secondary | ICD-10-CM | POA: Insufficient documentation

## 2015-01-29 DIAGNOSIS — K219 Gastro-esophageal reflux disease without esophagitis: Secondary | ICD-10-CM | POA: Insufficient documentation

## 2015-01-29 DIAGNOSIS — Z79899 Other long term (current) drug therapy: Secondary | ICD-10-CM | POA: Insufficient documentation

## 2015-01-29 DIAGNOSIS — R1310 Dysphagia, unspecified: Secondary | ICD-10-CM | POA: Diagnosis present

## 2015-01-29 HISTORY — DX: Elevated prostate specific antigen (PSA): R97.20

## 2015-01-29 NOTE — ED Notes (Signed)
Pt d/c'd by Mallie SnooksKelly Connor, RN

## 2015-01-29 NOTE — ED Provider Notes (Signed)
CSN: [id number]     Arrival date & time 01/29/15  0145 History   First MD Initiated Contact with Patient 01/29/15 [phone]     Chief Complaint  Patient presents with  . Dysphagia     (Consider location/radiation/quality/duration/timing/severity/associated sxs/prior Treatment) HPI  This is a 59 year old man with a history of GERD. He is currently on Protonix  daily, though previously was on 40 milligrams daily. He is here with a one-week history of increased burping and a sensation that he has phlegm in his throat that is making burping and swallowing difficult. He sometimes has a sensation that he is having difficulty breathing although he denies any difficulty at the present time. He denies any pain.  Past Medical History  Diagnosis Date  . Hypertension   . High cholesterol   . GERD (gastroesophageal reflux disease)   . Elevated PSA    Past Surgical History  Procedure Laterality Date  . Back surgery      two back surgeries to help with sciatica   No family history on file. History  Substance Use Topics  . Smoking status: Never Smoker   . Smokeless tobacco: Not on file  . Alcohol Use: Yes     Comment: rarely    Review of Systems  All other systems reviewed and are negative.   Allergies  Caffeine; Famotidine; and Keflex  Home Medications   Prior to Admission medications   Medication Sig Start Date End Date Taking? Authorizing Provider  potassium chloride (K-DUR,KLOR-CON) 10 MEQ tablet Take 10 mEq by mouth daily.   Yes Historical Provider, MD  Alum & Mag Hydroxide-Simeth (GI COCKTAIL) SUSP suspension Take 30 mLs by mouth 3 (three) times daily. Shake well.  Take on an as needed basis 04/22/13   Geoffery Lyons, MD  aspirin EC 81 MG tablet Take 81 mg by mouth daily.    Historical Provider, MD  doxazosin (CARDURA) 4 MG tablet Take 2 mg by mouth at bedtime.     Historical Provider, MD  loratadine (CLARITIN) 10 MG tablet Take 1 tablet (10 mg total) by mouth daily. 07/04/13    Quita Skye, MD  metoCLOPramide (REGLAN) 10 MG tablet Take 1 tablet (10 mg total) by mouth every 6 (six) hours. 02/16/13   Marisa Severin, MD  metoprolol (TOPROL-XL) 200 MG 24 hr tablet Take 50 mg by mouth daily.     Historical Provider, MD  omeprazole (PRILOSEC) 20 MG capsule Take 1 capsule (20 mg total) by mouth daily. 02/16/13   Glynn Octave, MD  pantoprazole (PROTONIX) 20 MG tablet Take 2 tablets (40 mg total) by mouth daily. 04/21/13   Elson Areas, PA-C  simethicone (GAS-X) 80 MG chewable tablet Chew 1 tablet (80 mg total) by mouth every 6 (six) hours as needed (belching). 02/16/13   Marisa Severin, MD  Simethicone 125 MG TABS Take 1 tablet (125 mg total) by mouth 4 (four) times daily as needed. 04/22/13   Geoffery Lyons, MD  simvastatin (ZOCOR) 10 MG tablet Take 10 mg by mouth at bedtime.      Historical Provider, MD  sucralfate (CARAFATE) 1 GM/10ML suspension Take 10 mLs (1 g total) by mouth 4 (four) times daily. 04/21/13   Elson Areas, PA-C  triamterene-hydrochlorothiazide (MAXZIDE-25) 37.5-25 MG per tablet Take 1 tablet by mouth daily.    Historical Provider, MD   BP 143/93 mmHg  Pulse 84  Temp(Src) 98 F (36.7 C) (Oral)  Resp 16  Ht  (1.778 m)  Wt 185 lb (  83.915 kg)  BMI 26.54 kg/m2  SpO2 99%   Physical Exam  General: Well-developed, well-nourished male in no acute distress; appearance consistent with age of record HENT: normocephalic; atraumatic; pharynx normal; large tongue; no stridor; no thyromegaly; no dysphonia Eyes: pupils equal, round and reactive to light; extraocular muscles intact Neck: supple Heart: regular rate and rhythm Lungs: clear to auscultation bilaterally Abdomen: soft; nondistended; nontender; bowel sounds present Extremities: No deformity; full range of motion; pulses normal Neurologic: Awake, alert and oriented; motor function intact in all extremities and symmetric; no facial droop Skin: Warm and dry Psychiatric: Normal mood and affect    ED Course   Procedures (including critical care time)   MDM  Nursing notes and vitals signs, including pulse oximetry, reviewed.  Summary of this visit's results, reviewed by myself:  Imaging Studies: Dg Neck Soft Tissue  01/29/2015   CLINICAL DATA:  Difficulty swallowing for 1 week. Foreign body sensation.  EXAM: NECK SOFT TISSUES - 1+ VIEW  COMPARISON:  None.  FINDINGS: There is no evidence of retropharyngeal soft tissue swelling or epiglottic enlargement. The cervical airway is unremarkable and no radio-opaque foreign body identified.  IMPRESSION: Negative.   Electronically Signed   By: Ellery Plunkaniel R Mitchell M.D.   On: 01/29/2015 04:22   4:29 AM Patient advised of x-ray findings. He has been taking Protonix 20 milligrams. He was advised to increase this to 40 milligrams daily.   Paula LibraJohn Clovia Reine, MD 01/29/15 0430

## 2015-01-29 NOTE — ED Notes (Signed)
Trouble swallowing x1 week. Sts he feels like "something is in there".  Denies pain.  Reports a lot of Phlegm. Reports similar episode when he was being treated for GERD and had an EGD.

## 2015-07-10 ENCOUNTER — Encounter (HOSPITAL_BASED_OUTPATIENT_CLINIC_OR_DEPARTMENT_OTHER): Payer: Self-pay | Admitting: Emergency Medicine

## 2015-07-10 ENCOUNTER — Emergency Department (HOSPITAL_BASED_OUTPATIENT_CLINIC_OR_DEPARTMENT_OTHER): Payer: Managed Care, Other (non HMO)

## 2015-07-10 ENCOUNTER — Emergency Department (HOSPITAL_BASED_OUTPATIENT_CLINIC_OR_DEPARTMENT_OTHER)
Admission: EM | Admit: 2015-07-10 | Discharge: 2015-07-10 | Disposition: A | Discharge status: Home / Self Care | Payer: Managed Care, Other (non HMO) | Attending: Emergency Medicine | Admitting: Emergency Medicine

## 2015-07-10 DIAGNOSIS — K598 Other specified functional intestinal disorders: Secondary | ICD-10-CM | POA: Diagnosis not present

## 2015-07-10 DIAGNOSIS — K219 Gastro-esophageal reflux disease without esophagitis: Secondary | ICD-10-CM | POA: Diagnosis present

## 2015-07-10 DIAGNOSIS — Z79899 Other long term (current) drug therapy: Secondary | ICD-10-CM | POA: Diagnosis not present

## 2015-07-10 DIAGNOSIS — E780 Pure hypercholesterolemia: Secondary | ICD-10-CM | POA: Insufficient documentation

## 2015-07-10 DIAGNOSIS — K9289 Other specified diseases of the digestive system: Secondary | ICD-10-CM

## 2015-07-10 DIAGNOSIS — I10 Essential (primary) hypertension: Secondary | ICD-10-CM | POA: Insufficient documentation

## 2015-07-10 MED ORDER — METOCLOPRAMIDE HCL 10 MG PO TABS: 10.0000 mg | ORAL_TABLET | Freq: Four times a day (QID) | ORAL | Status: DC | PRN

## 2015-07-10 MED ORDER — METOCLOPRAMIDE HCL 10 MG PO TABS
10.0000 mg | ORAL_TABLET | Freq: Once | ORAL | Status: AC
  Administered 2015-07-10: 10 mg via ORAL
  Filled 2015-07-10: qty 1

## 2015-07-10 MED ORDER — METOCLOPRAMIDE HCL 10 MG PO TABS
10.0000 mg | ORAL_TABLET | Freq: Once | ORAL | Status: DC
  Filled 2015-07-10: qty 1

## 2015-07-10 MED ORDER — SIMETHICONE 40 MG/0.6ML PO SUSP (UNIT DOSE)
40.0000 mg | Freq: Once | ORAL | Status: AC
  Administered 2015-07-10: 40 mg via ORAL
  Filled 2015-07-10: qty 0.6

## 2015-07-10 MED ORDER — GI COCKTAIL ~~LOC~~
30.0000 mL | Freq: Once | ORAL | Status: AC
  Administered 2015-07-10: 30 mL via ORAL
  Filled 2015-07-10: qty 30

## 2015-07-10 NOTE — ED Notes (Signed)
Pt states he has been burping a lot, and that his throat feels "tighter". Symptoms have been going on for approximately 2 weeks. Has taken Protonix at home. Denies any other symptoms.

## 2015-07-10 NOTE — ED Provider Notes (Signed)
CSN: [id number]     Arrival date & time 07/10/15  0015 History   First MD Initiated Contact with Patient 07/10/15 0035     Chief Complaint  Patient presents with  . Gastrophageal Reflux     (Consider location/radiation/quality/duration/timing/severity/associated sxs/prior Treatment) HPI  This is a 59 year old male with a history of gastroesophageal reflux and multiple visits to the ED for the same. His symptoms of worsened over the past 2 weeks. He is complaining of a tight sensation in his throat. He has also been burping excessively. Symptoms are worse at night depending on his sleeping position. He denies chest pain, shortness of breath, abdominal pain, nausea or vomiting.  Past Medical History  Diagnosis Date  . Hypertension   . High cholesterol   . GERD (gastroesophageal reflux disease)   . Elevated PSA    Past Surgical History  Procedure Laterality Date  . Back surgery      two back surgeries to help with sciatica  . Appendectomy     History reviewed. No pertinent family history. Social History  Substance Use Topics  . Smoking status: Never Smoker   . Smokeless tobacco: None  . Alcohol Use: Yes     Comment: rarely    Review of Systems  All other systems reviewed and are negative.   Allergies  Caffeine; Ciprofloxacin; Famotidine; and Keflex  Home Medications   Prior to Admission medications   Medication Sig Start Date End Date Taking? Authorizing Provider  doxazosin (CARDURA) 4 MG tablet Take 2 mg by mouth at bedtime.     Historical Provider, MD  metoprolol (TOPROL-XL) 200 MG 24 hr tablet Take 50 mg by mouth daily.     Historical Provider, MD  pantoprazole (PROTONIX) 20 MG tablet Take 2 tablets (40 mg total) by mouth daily. 04/21/13   Elson Areas, PA-C  potassium chloride (K-DUR,KLOR-CON) 10 MEQ tablet Take 10 mEq by mouth daily.    Historical Provider, MD  simvastatin (ZOCOR) 10 MG tablet Take 10 mg by mouth at bedtime.      Historical Provider, MD   triamterene-hydrochlorothiazide (MAXZIDE-25) 37.5-25 MG per tablet Take 1 tablet by mouth daily.    Historical Provider, MD   BP 120/99 mmHg  Pulse 74  Temp(Src) 98.2 F (36.8 C) (Oral)  Resp 17  Ht  (1.778 m)  Wt 183 lb (83.008 kg)  BMI 26.26 kg/m2  SpO2 99%   Physical Exam  General: Well-developed, well-nourished male in no acute distress; appearance consistent with age of record HENT: normocephalic; atraumatic; pharynx normal; no dysphonia; no stridor Eyes: pupils equal, round and reactive to light; extraocular muscles intact Neck: supple Heart: regular rate and rhythm Lungs: clear to auscultation bilaterally Abdomen: soft; nondistended; nontender; no masses or hepatosplenomegaly; bowel sounds present; persistent belching when not speaking Extremities: No deformity; full range of motion; pulses normal Neurologic: Awake, alert and oriented; motor function intact in all extremities and symmetric; no facial droop Skin: Warm and dry Psychiatric: Normal mood and affect    ED Course  Procedures (including critical care time)   MDM  Nursing notes and vitals signs, including pulse oximetry, reviewed.  Summary of this visit's results, reviewed by myself:  Imaging Studies: Dg Abd Acute W/chest  07/10/2015   CLINICAL DATA:  Patient is been burping a lot in his throat feels tight for about 2 weeks.  EXAM: DG ABDOMEN ACUTE W/ 1V CHEST  COMPARISON:  04/22/2013  FINDINGS: Shallow inspiration with linear atelectasis in the lung bases. Normal  heart size and pulmonary vascularity. No focal airspace disease or consolidation in the lungs. No blunting of costophrenic angles. No pneumothorax. Mediastinal contours appear intact.  Gas and stool throughout the colon. Gas-filled small bowel. No small or large bowel distention. No free intra-abdominal air. No abnormal air-fluid levels. Calcified phleboliths in the pelvic region. No radiopaque stones. Visualized bones appear intact.  IMPRESSION:  Shallow inspiration with atelectasis in the lung bases. Nonobstructive bowel gas pattern. Gas-filled nondilated bowel suggest ileus.   Electronically Signed   By: Burman Nieves M.D.   On: 07/10/2015 01:51   1:58 AM Patient has had ileus pattern on x-rays in the past. We will treat him with a short course of Reglan and refer him to gastroenterology. He does not have a local gastroenterologist.       Paula Libra, MD 07/10/15 0200

## 2016-01-29 ENCOUNTER — Emergency Department (HOSPITAL_BASED_OUTPATIENT_CLINIC_OR_DEPARTMENT_OTHER)
Admission: EM | Admit: 2016-01-29 | Discharge: 2016-01-29 | Disposition: A | Discharge status: Home / Self Care | Payer: Managed Care, Other (non HMO) | Attending: Emergency Medicine | Admitting: Emergency Medicine

## 2016-01-29 ENCOUNTER — Encounter (HOSPITAL_BASED_OUTPATIENT_CLINIC_OR_DEPARTMENT_OTHER): Payer: Self-pay | Admitting: *Deleted

## 2016-01-29 DIAGNOSIS — R630 Anorexia: Secondary | ICD-10-CM | POA: Diagnosis not present

## 2016-01-29 DIAGNOSIS — K219 Gastro-esophageal reflux disease without esophagitis: Secondary | ICD-10-CM | POA: Diagnosis not present

## 2016-01-29 DIAGNOSIS — R5383 Other fatigue: Secondary | ICD-10-CM | POA: Diagnosis present

## 2016-01-29 DIAGNOSIS — R531 Weakness: Secondary | ICD-10-CM

## 2016-01-29 DIAGNOSIS — Z87430 Personal history of prostatic dysplasia: Secondary | ICD-10-CM | POA: Diagnosis not present

## 2016-01-29 DIAGNOSIS — R350 Frequency of micturition: Secondary | ICD-10-CM | POA: Diagnosis not present

## 2016-01-29 DIAGNOSIS — I10 Essential (primary) hypertension: Secondary | ICD-10-CM | POA: Insufficient documentation

## 2016-01-29 DIAGNOSIS — Z79899 Other long term (current) drug therapy: Secondary | ICD-10-CM | POA: Insufficient documentation

## 2016-01-29 DIAGNOSIS — E7800 Pure hypercholesterolemia, unspecified: Secondary | ICD-10-CM | POA: Diagnosis not present

## 2016-01-29 LAB — URINALYSIS, ROUTINE W REFLEX MICROSCOPIC
BILIRUBIN URINE: NEGATIVE
Glucose, UA: NEGATIVE mg/dL
HGB URINE DIPSTICK: NEGATIVE
Ketones, ur: NEGATIVE mg/dL
Leukocytes, UA: NEGATIVE
Nitrite: NEGATIVE
PH: 5.5 (ref 5.0–8.0)
Protein, ur: NEGATIVE mg/dL
Specific Gravity, Urine: 1.013 (ref 1.005–1.030)

## 2016-01-29 LAB — COMPREHENSIVE METABOLIC PANEL
ALBUMIN: 4.5 g/dL (ref 3.5–5.0)
ALT: 40 U/L (ref 17–63)
AST: 30 U/L (ref 15–41)
Alkaline Phosphatase: 75 U/L (ref 38–126)
Anion gap: 8 (ref 5–15)
BUN: 17 mg/dL (ref 6–20)
CALCIUM: 8.9 mg/dL (ref 8.9–10.3)
CO2: 26 mmol/L (ref 22–32)
CREATININE: 1.18 mg/dL (ref 0.61–1.24)
Chloride: 102 mmol/L (ref 101–111)
GFR calc non Af Amer: >60 mL/min (ref >60)
GLUCOSE: 99 mg/dL (ref 65–99)
Potassium: 3.5 mmol/L (ref 3.5–5.1)
Sodium: 136 mmol/L (ref 135–145)
Total Bilirubin: 2.1 mg/dL — ABNORMAL HIGH (ref 0.3–1.2)
Total Protein: 7.6 g/dL (ref 6.5–8.1)

## 2016-01-29 LAB — CBC WITH DIFFERENTIAL/PLATELET
Basophils Absolute: 0 10*3/uL (ref 0.0–0.1)
Basophils Relative: 1 %
EOS ABS: 0 10*3/uL (ref 0.0–0.7)
Eosinophils Relative: 1 %
HCT: 43.6 % (ref 39.0–52.0)
HEMOGLOBIN: 14.9 g/dL (ref 13.0–17.0)
LYMPHS ABS: 1.2 10*3/uL (ref 0.7–4.0)
Lymphocytes Relative: 30 %
MCH: 29.7 pg (ref 26.0–34.0)
MCHC: 34.2 g/dL (ref 30.0–36.0)
MCV: 87 fL (ref 78.0–100.0)
Monocytes Absolute: 0.4 10*3/uL (ref 0.1–1.0)
Monocytes Relative: 10 %
NEUTROS ABS: 2.4 10*3/uL (ref 1.7–7.7)
NEUTROS PCT: 58 %
Platelets: 165 10*3/uL (ref 150–400)
RBC: 5.01 MIL/uL (ref 4.22–5.81)
RDW: 13.6 % (ref 11.5–15.5)
WBC: 4 10*3/uL (ref 4.0–10.5)

## 2016-01-29 LAB — CBG MONITORING, ED: Glucose-Capillary: 101 mg/dL — ABNORMAL HIGH (ref 65–99)

## 2016-01-29 NOTE — ED Provider Notes (Signed)
CSN: [id number]     Arrival date & time 01/29/16  1037 History   First MD Initiated Contact with Patient 01/29/16 1120     Chief Complaint  Patient presents with  . Fatigue     (Consider location/radiation/quality/duration/timing/severity/associated sxs/prior Treatment) HPI   Joel Young is a 60 y.o. male, with a history of hypertension and prostatic hypertrophy, presenting to the ED with fatigue, generalized weakness, and loss of appetite for the last week. Pt also endorses some urinary frequency. Pt denies any pain, N/V/D, rashes, weight loss, or any other complaints.    Past Medical History  Diagnosis Date  . Hypertension   . High cholesterol   . GERD (gastroesophageal reflux disease)   . Elevated PSA    Past Surgical History  Procedure Laterality Date  . Back surgery      two back surgeries to help with sciatica  . Appendectomy     History reviewed. No pertinent family history. Social History  Substance Use Topics  . Smoking status: Never Smoker   . Smokeless tobacco: None  . Alcohol Use: Yes     Comment: rarely    Review of Systems  Constitutional: Positive for appetite change and fatigue. Negative for fever and chills.  Respiratory: Negative for shortness of breath.   Cardiovascular: Negative for chest pain.  Gastrointestinal: Negative for nausea, vomiting, abdominal pain, diarrhea and constipation.  Endocrine: Positive for polyuria.  Genitourinary: Negative for dysuria and flank pain.  Neurological: Positive for weakness (generalized). Negative for dizziness, syncope, light-headedness, numbness and headaches.  All other systems reviewed and are negative.     Allergies  Caffeine; Ciprofloxacin; Famotidine; and Keflex  Home Medications   Prior to Admission medications   Medication Sig Start Date End Date Taking? Authorizing Provider  metoprolol (TOPROL-XL) 200 MG 24 hr tablet Take 50 mg by mouth daily.     Historical Provider, MD  pantoprazole  (PROTONIX) 20 MG tablet Take 2 tablets (40 mg total) by mouth daily. 04/21/13   Elson Areas, PA-C  potassium chloride (K-DUR,KLOR-CON) 10 MEQ tablet Take 10 mEq by mouth daily.    Historical Provider, MD  simvastatin (ZOCOR) 10 MG tablet Take 10 mg by mouth at bedtime.      Historical Provider, MD  triamterene-hydrochlorothiazide (MAXZIDE-25) 37.5-25 MG per tablet Take 1 tablet by mouth daily.    Historical Provider, MD   BP 152/86 mmHg  Pulse 87  Temp(Src) 98.2 F (36.8 C) (Oral)  Resp 21  Ht  (1.778 m)  Wt 77.111 kg  BMI 24.39 kg/m2  SpO2 99% Physical Exam  Constitutional: He is oriented to person, place, and time. He appears well-developed and well-nourished. No distress.  HENT:  Head: Normocephalic and atraumatic.  Eyes: Conjunctivae and EOM are normal. Pupils are equal, round, and reactive to light.  Neck: Normal range of motion. Neck supple. No thyromegaly present.  Cardiovascular: Normal rate, regular rhythm, normal heart sounds and intact distal pulses.   Pulmonary/Chest: Effort normal and breath sounds normal. No respiratory distress.  Abdominal: Soft. Bowel sounds are normal. There is no tenderness. There is no guarding.  Musculoskeletal: He exhibits no edema or tenderness.  Full ROM in all extremities and spine. No paraspinal tenderness.   Lymphadenopathy:    He has no cervical adenopathy.  Neurological: He is alert and oriented to person, place, and time. He has normal reflexes.  No sensory deficits. Strength 5/5 in all extremities. No gait disturbance. Coordination intact. Cranial nerves III-XII grossly intact. No  facial droop.   Skin: Skin is warm and dry. He is not diaphoretic.  Psychiatric: He has a normal mood and affect. His behavior is normal.  Nursing note and vitals reviewed.   ED Course  Procedures (including critical care time) Labs Review Labs Reviewed  COMPREHENSIVE METABOLIC PANEL - Abnormal; Notable for the following:    Total Bilirubin 2.1 (*)     All other components within normal limits  CBG MONITORING, ED - Abnormal; Notable for the following:    Glucose-Capillary 101 (*)    All other components within normal limits  URINE CULTURE  CBC WITH DIFFERENTIAL/PLATELET  URINALYSIS, ROUTINE W REFLEX MICROSCOPIC (NOT AT [facility])    Imaging Review No results found. I have personally reviewed and evaluated these lab results as part of my medical decision-making.   EKG Interpretation None       ED ECG REPORT   Date: 01/29/2016  Rate: 79  Rhythm: normal sinus rhythm  QRS Axis: normal  Intervals: normal  ST/T Wave abnormalities: normal and nonspecific ST/T changes with artifact  Conduction Disutrbances:none  Narrative Interpretation:   Old EKG Reviewed: none available  I have personally reviewed the EKG tracing.   Orthostatic VS for the past 24 hrs:  BP- Lying Pulse- Lying BP- Sitting Pulse- Sitting BP- Standing at 0 minutes Pulse- Standing at 0 minutes  01/29/16 1301 152/86 mmHg 81 (!) 158/108 mmHg 83 (!) 151/106 mmHg 91      MDM   Final diagnoses:  Other fatigue  Generalized weakness  Loss of appetite    Joel MangoClement Young presents with complaints of generalized weakness, fatigue, and loss of appetite for the past week.  Findings and plan of care discussed with Joel ScottMartha Linker, MD.  Patient is nontoxic appearing, afebrile, not tachycardic, not tachypneic, maintains SPO2 of 99-100% on room air, and is in no apparent distress. Patient has no signs of sepsis or other serious or life-threatening condition. It is possible that patient has a thyroid or other similar endocrine abnormality. Life threats have been ruled out to the best of our ability here in the ED. Patient was advised to follow-up with his PCP for further testing. Patient voiced understanding of these instructions and is comfortable with discharge.    Filed Vitals:   01/29/16 1041 01/29/16 1200 01/29/16 1300  BP: 154/99 152/98 152/86  Pulse: 88 83 87  Temp:  98.2 F (36.8 C)    TempSrc: Oral    Resp: 18 18 21   Height: 5\' 10"  (1.778 m)    Weight: 77.111 kg    SpO2: 100% 100% 99%      Joel PancoastShawn C Joy, PA-C 01/30/16 0814  Joel ScottMartha Linker, MD 01/30/16 [phone]

## 2016-01-29 NOTE — ED Notes (Signed)
Pt reports acid reflux has been 'acting up'.  Reports that he hasn't had much of an appetite and hasnt been eating well recently.  Reports that he has been feeling weak in the mornings until he eats.  Reports that after he eats, he feels much better.  Pt ambulatory.  Denies pain.  Pt A/O x 4.

## 2016-01-29 NOTE — ED Provider Notes (Signed)
ED ECG REPORT   Date: 01/29/2016  Rate: 79  Rhythm: normal sinus rhythm  QRS Axis: normal  Intervals: normal  ST/T Wave abnormalities: nonspecific ST/T changes with artifact  Conduction Disutrbances:none  Narrative Interpretation:   Old EKG Reviewed: none available     Jerelyn ScottMartha Linker, MD 01/29/16 [phone]

## 2016-01-29 NOTE — Discharge Instructions (Signed)
You have been seen today for fatigue, appetite loss, and generalized weakness. Your imaging and lab tests showed no abnormalities. Follow up with PCP as soon as possible for further lab testing and reevaluation. Return to ED should symptoms worsen.  RESOURCE GUIDE  Chronic Pain Problems: Contact [facility]  [phone] Patients need to be referred by their primary care doctor.  Insufficient Money for Medicine: Contact United Way:  call "211" or [facility] [phone].  No Primary Care Doctor: - Call [facility]  [phone] - can help you locate a primary care doctor that  accepts your insurance, provides certain services, etc. - Physician Referral Service- [phone]  Agencies that provide inexpensive medical care: - [facility]  [phone] - [facility]  [phone] - [facility]  [phone] - Women's Clinic  [phone] - Planned Parenthood  [phone] [facility]  [phone]  Medicaid-accepting [facility] Providers: - [facility]- [address]  [phone], Mon-Fri 9am-7pm, Sat 9am-1pm - [facility]- [address] [place], [address] [place]  [phone] - [facility]- [address], [address] [place]  [phone] [facility]- [address]  [phone] - Renaye Rakers- [address] [place], [address], [phone]  Only accepts [place] patients after they have their name  applied to their card  Self Pay (no insurance) in [place]: - Sickle Cell Patients: Dr Willey Blade, [facility]  [address] [place], [phone] - [facility]- [address] [place]  [phone]       [facility] [place]- [address]       -     [facility]- see information above (Speak to [employer] if you do not have insurance)       -  [facility]- [address] [place], [phone]       -  [facility]- [address],  [phone]       -  [facility]- [address] [phone]       -  Dr Julio Sicks-  [address], [place], [phone]       -  [facility]- [address], [phone]       -  [facility]- [address], [phone], also [address] [phone]       -    [facility] [place] [facility]- [address] [place], [phone], 1st & 3rd Saturday   every month, 10am-1pm  1) Find a Doctor and Pay Out of Pocket Although you won't have to find out who is covered by your insurance plan, it is a good idea to ask around and get recommendations. You will then need to call the office and see if the doctor you have chosen will accept you as a new patient and what types of options they offer for patients who are self-pay. Some doctors offer discounts or will set up payment plans for their patients who do not have insurance, but you will need to ask so you aren't surprised when you get to your appointment.  2) Contact Your Local Health Department Not all health departments have doctors that can see patients for sick visits, but many do, so it is worth a call to  see if yours does. If you don't know where your local health department is, you can check in your phone book. The CDC also has a tool to help you locate your state's health department, and many state websites also have listings of all of their local health departments.  3) Find a Walk-in Clinic If your illness is not likely to be very severe or complicated, you may want to try a walk in clinic. These are popping up all over the country in pharmacies, drugstores, and shopping centers. They're usually staffed by nurse practitioners or physician assistants that have been trained to treat common illnesses and complaints. They're usually fairly quick and inexpensive. However, if you have serious medical issues or chronic medical problems, these are  probably not your best option  STD Testing - [facility] [place] [place] Department of [facility] [place], STD Clinic, [address], [place], phone [phone] or [phone].  Monday - Friday, call for an appointment. [facility]- [place] Department of [facility] [place], STD Clinic, [address], [place], phone [phone] or [phone].  Monday - Friday, call for an appointment.  Abuse/Neglect: [facility]- [place] Child Abuse Hotline (870)350-9504(336) (567)715-9822 [facility]- [place] Child Abuse Hotline (514) 812-1561(919) 689-5055 (After Hours)  Emergency Shelter:  [facility] (825) 314-6201(336) [phone]  Maternity Homes: - Room at the [place] [facility] [phone](336) [phone] - [facility] (905) 682-8865(704) [phone]  MRSA Hotline #:   778 635 80013100339123  [facility] Resources  [facility] [place]  United Way [facility] [address]                 [address]         [address] [place]  [place]                                               [place]                              [place] Phone:  [phone]                                  Phone:  [phone]                   Phone:  [phone]  [facility], [phone] - [facility] Services - [phone]- [phone]       -     [facility] in [place], [address],                                  [phone], [facility]  [place] Child Abuse Hotline 228-065-4146(336) (872) 118-3649 or 757-346-1854(336) 775-337-2826 (After Hours)   Behavioral Health Services  Substance Abuse Resources: - [facility] Drug Services  [phone] - [facility] [phone] - [facility] [phone] Floydene Flock- [facility] [phone] - Residential & Outpatient Substance Abuse Program  [phone]  Psychological Services: Tressie Ellis- [facility]  [phone] - [facility]  ([phone]) [phone] - [facility], [phone] [place] [address], [place], ACCESS LINE:  [phone] or ([phone], [email or web address]  Dental Assistance  If unable to pay  or uninsured, contact:  [facility] or [facility] to become qualified for the adult dental clinic.  Patients with Medicaid: [facility] [phone], [phone] [address], [phone]  If unable to pay, or uninsured, contact [facility] [phone]) or [facility] Department [phone] in [place], [phone] in [facility]) to become qualified for the adult dental clinic   Other Low-Cost Community Dental Services: - [facility]- [address] [place], [place], [place], [address], [phone], Ext. [phone], 2nd and 4th Thursday of the month at 6:30am.  10 clients each day by appointment, can sometimes see walk-in patients if someone does not show for an appointment. [facility]- [address] [place], [place], [address], [phone] - [facility]- [address], [place], [place], [address], [phone] - [facility]- [phone] [facility] Health Department- [phone] [facility] Department- [phone]

## 2016-01-31 LAB — URINE CULTURE: Culture: 1000

## 2016-06-08 ENCOUNTER — Emergency Department (HOSPITAL_BASED_OUTPATIENT_CLINIC_OR_DEPARTMENT_OTHER): Payer: Managed Care, Other (non HMO)

## 2016-06-08 ENCOUNTER — Encounter (HOSPITAL_BASED_OUTPATIENT_CLINIC_OR_DEPARTMENT_OTHER): Payer: Self-pay | Admitting: *Deleted

## 2016-06-08 ENCOUNTER — Emergency Department (HOSPITAL_BASED_OUTPATIENT_CLINIC_OR_DEPARTMENT_OTHER)
Admission: EM | Admit: 2016-06-08 | Discharge: 2016-06-08 | Disposition: A | Discharge status: Home / Self Care | Payer: Managed Care, Other (non HMO) | Attending: Emergency Medicine | Admitting: Emergency Medicine

## 2016-06-08 DIAGNOSIS — K219 Gastro-esophageal reflux disease without esophagitis: Secondary | ICD-10-CM | POA: Insufficient documentation

## 2016-06-08 DIAGNOSIS — Z79899 Other long term (current) drug therapy: Secondary | ICD-10-CM | POA: Diagnosis not present

## 2016-06-08 DIAGNOSIS — I10 Essential (primary) hypertension: Secondary | ICD-10-CM | POA: Diagnosis not present

## 2016-06-08 DIAGNOSIS — R1013 Epigastric pain: Secondary | ICD-10-CM | POA: Diagnosis present

## 2016-06-08 LAB — CBC WITH DIFFERENTIAL/PLATELET
BASOS ABS: 0 10*3/uL (ref 0.0–0.1)
Basophils Relative: 0 %
Eosinophils Absolute: 0.1 10*3/uL (ref 0.0–0.7)
Eosinophils Relative: 2 %
HEMATOCRIT: 45.1 % (ref 39.0–52.0)
Hemoglobin: 15.5 g/dL (ref 13.0–17.0)
LYMPHS PCT: 40 %
Lymphs Abs: 2.1 10*3/uL (ref 0.7–4.0)
MCH: 30.3 pg (ref 26.0–34.0)
MCHC: 34.4 g/dL (ref 30.0–36.0)
MCV: 88.3 fL (ref 78.0–100.0)
Monocytes Absolute: 0.5 10*3/uL (ref 0.1–1.0)
Monocytes Relative: 9 %
NEUTROS ABS: 2.6 10*3/uL (ref 1.7–7.7)
Neutrophils Relative %: 49 %
Platelets: 149 10*3/uL — ABNORMAL LOW (ref 150–400)
RBC: 5.11 MIL/uL (ref 4.22–5.81)
RDW: 13.7 % (ref 11.5–15.5)
WBC: 5.3 10*3/uL (ref 4.0–10.5)

## 2016-06-08 LAB — BASIC METABOLIC PANEL
Anion gap: 10 (ref 5–15)
BUN: 17 mg/dL (ref 6–20)
CHLORIDE: 104 mmol/L (ref 101–111)
CO2: 23 mmol/L (ref 22–32)
CREATININE: 1.12 mg/dL (ref 0.61–1.24)
Calcium: 9.2 mg/dL (ref 8.9–10.3)
GFR calc Af Amer: >60 mL/min (ref >60)
GFR calc non Af Amer: >60 mL/min (ref >60)
GLUCOSE: 162 mg/dL — AB (ref 65–99)
POTASSIUM: 3.4 mmol/L — AB (ref 3.5–5.1)
Sodium: 137 mmol/L (ref 135–145)

## 2016-06-08 LAB — HEPATIC FUNCTION PANEL
ALT: 18 U/L (ref 17–63)
AST: 24 U/L (ref 15–41)
Albumin: 4.2 g/dL (ref 3.5–5.0)
Alkaline Phosphatase: 69 U/L (ref 38–126)
BILIRUBIN DIRECT: 0.2 mg/dL (ref 0.1–0.5)
BILIRUBIN TOTAL: 1.5 mg/dL — AB (ref 0.3–1.2)
Indirect Bilirubin: 1.3 mg/dL — ABNORMAL HIGH (ref 0.3–0.9)
Total Protein: 7.6 g/dL (ref 6.5–8.1)

## 2016-06-08 LAB — LIPASE, BLOOD: Lipase: 41 U/L (ref 11–51)

## 2016-06-08 LAB — TROPONIN I

## 2016-06-08 MED ORDER — PANTOPRAZOLE SODIUM 40 MG PO TBEC
40.0000 mg | DELAYED_RELEASE_TABLET | Freq: Once | ORAL | Status: AC
  Administered 2016-06-08: 40 mg via ORAL
  Filled 2016-06-08: qty 1

## 2016-06-08 MED ORDER — SODIUM CHLORIDE 0.9 % IV BOLUS (SEPSIS)
1000.0000 mL | Freq: Once | INTRAVENOUS | Status: AC
  Administered 2016-06-08: 1000 mL via INTRAVENOUS

## 2016-06-08 NOTE — ED Notes (Addendum)
Pt on heart monitor 

## 2016-06-08 NOTE — ED Notes (Signed)
Patient transported to X-ray 

## 2016-06-08 NOTE — ED Triage Notes (Signed)
States he has been having reflux since 2014. He is taking medication for same. Last night he started with reflux followed by fast heart rate.

## 2016-06-08 NOTE — ED Provider Notes (Signed)
[facility] Provider Note   CSN: [id number] Arrival date & time: 06/08/16  1133  First Provider Contact:  First MD Initiated Contact with Patient 06/08/16 1149        History   Chief Complaint Chief Complaint  Patient presents with  . Gastroesophageal Reflux    HPI Joel Young is a 60 y.o. male.  HPI   Joel Young is a 60 y.o. male, with a history of MI, GERD, and HTN, presenting to the ED with intermittent burping and epigastric discomfort since last night. Pt describes the episodes as, "sudden flashes of discomfort from the stomach." Accompanied by a rapid heart rate for a few seconds. Usually takes protonix but has not taken it today. Pt states he has had similar symptoms in the past, but states they resolved spontaneously and were not to this intensity. Does not seem to be connected with eating, nor does the discomfort increase with lying down flat. Pt denies fever/chills, shortness of breath, chest pain, N/V/C/D, or any other complaints. Patient states that he drinks about 10 drinks a week.       Past Medical History:  Diagnosis Date  . Elevated PSA   . GERD (gastroesophageal reflux disease)   . High cholesterol   . Hypertension     There are no active problems to display for this patient.   Past Surgical History:  Procedure Laterality Date  . APPENDECTOMY    . BACK SURGERY     two back surgeries to help with sciatica       Home Medications    Prior to Admission medications   Medication Sig Start Date End Date Taking? Authorizing Provider  metoprolol (TOPROL-XL) 200 MG 24 hr tablet Take 50 mg by mouth daily.     Historical Provider, MD  pantoprazole (PROTONIX) 20 MG tablet Take 2 tablets (40 mg total) by mouth daily. 04/21/13   Elson Areas, PA-C  potassium chloride (K-DUR,KLOR-CON) 10 MEQ tablet Take 10 mEq by mouth daily.    Historical Provider, MD  simvastatin (ZOCOR) 10 MG tablet Take 10 mg by mouth at bedtime.      Historical  Provider, MD  triamterene-hydrochlorothiazide (MAXZIDE-25) 37.5-25 MG per tablet Take 1 tablet by mouth daily.    Historical Provider, MD    Family History No family history on file.  Social History Social History  Substance Use Topics  . Smoking status: Never Smoker  . Smokeless tobacco: Never Used  . Alcohol use Yes     Comment: rarely     Allergies   Caffeine; Ciprofloxacin; Famotidine; and Keflex [cephalexin]   Review of Systems Review of Systems  Constitutional: Negative for chills, diaphoresis and fever.  Respiratory: Negative for shortness of breath.   Cardiovascular: Negative for chest pain.  Gastrointestinal: Positive for abdominal pain (epigastric, intermittent). Negative for constipation, diarrhea, nausea and vomiting.  Genitourinary: Negative for dysuria.  All other systems reviewed and are negative.    Physical Exam Updated Vital Signs BP 153/97   Pulse 95   Temp 98.2 F (36.8 C) (Oral)   Resp 20   Ht  (1.778 m)   Wt 79.4 kg   SpO2 99%   BMI 25.11 kg/m   Physical Exam  Constitutional: He appears well-developed and well-nourished. No distress.  HENT:  Head: Normocephalic and atraumatic.  Eyes: Conjunctivae are normal.  Neck: Neck supple.  Cardiovascular: Normal rate, regular rhythm, normal heart sounds and intact distal pulses.   Pulmonary/Chest: Effort normal and breath sounds  normal. No respiratory distress.  Abdominal: Soft. There is no tenderness. There is no guarding.  Musculoskeletal: He exhibits no edema or tenderness.  Lymphadenopathy:    He has no cervical adenopathy.  Neurological: He is alert.  Skin: Skin is warm and dry. He is not diaphoretic.  Psychiatric: He has a normal mood and affect. His behavior is normal.  Nursing note and vitals reviewed.    ED Treatments / Results  Labs (all labs ordered are listed, but only abnormal results are displayed) Labs Reviewed  BASIC METABOLIC PANEL - Abnormal; Notable for the  following:       Result Value   Potassium 3.4 (*)    Glucose, Bld 162 (*)    All other components within normal limits  CBC WITH DIFFERENTIAL/PLATELET - Abnormal; Notable for the following:    Platelets 149 (*)    All other components within normal limits  HEPATIC FUNCTION PANEL - Abnormal; Notable for the following:    Total Bilirubin 1.5 (*)    Indirect Bilirubin 1.3 (*)    All other components within normal limits  TROPONIN I  LIPASE, BLOOD    EKG  EKG Interpretation  Date/Time:  Thursday June 08 2016 11:49:41 EDT Ventricular Rate:  96 PR Interval:    QRS Duration: 100 QT Interval:  346 QTC Calculation: 438 R Axis:   -40 Text Interpretation:  Sinus rhythm Consider right atrial enlargement Incomplete RBBB and LAFB ST elevation, consider anterior injury No significant change since last tracing Confirmed by Anitra Lauth  MD, Alphonzo Lemmings ([id number]) on 06/08/2016 11:57:30 AM Also confirmed by Anitra Lauth  MD, Alphonzo Lemmings ([id number]), editor Monserrate, Cala Bradford [phone])  on 06/08/2016 12:25:39 PM       Radiology Dg Chest 2 View  Result Date: 06/08/2016 CLINICAL DATA:  Tachycardia for 2 days.  History of hypertension EXAM: CHEST  2 VIEW COMPARISON:  07/10/2015 FINDINGS: Heart and mediastinal contours are within normal limits. No focal opacities or effusions. No acute bony abnormality. Degenerative spurring in the thoracic spine. IMPRESSION: No active cardiopulmonary disease. Electronically Signed   By: Charlett Nose M.D.   On: 06/08/2016 12:38   Procedures Procedures (including critical care time)  Medications Ordered in ED Medications  pantoprazole (PROTONIX) EC tablet 40 mg (40 mg Oral Given 06/08/16 1221)  sodium chloride 0.9 % bolus 1,000 mL (0 mLs Intravenous Stopped 06/08/16 1339)     Initial Impression / Assessment and Plan / ED Course  I have reviewed the triage vital signs and the nursing notes.  Pertinent labs & imaging results that were available during my care of the patient were reviewed  by me and considered in my medical decision making (see chart for details).  Clinical Course    Joel Young presents with epigastric discomfort since last night.  Although this is likely an exacerbation of patient's GERD, due to the change in the nature of the discomfort, the accompanying tachycardia, and the patient's instinct that something is wrong, a further workup is warranted. HEART score is 4, indicating moderate risk for a cardiac event. Wells criteria score is 0, indicating low risk for PE. EKG abnormal, but without any change from previous. Patient remained pain free throughout the ED course. Patient encouraged to continue taking his medications as prescribed and counseled on reducing alcohol intake. The patient was given further instructions for home care as well as return precautions. Patient voices understanding of these instructions, accepts the plan, and is comfortable with discharge.  Vitals:   06/08/16 1142 06/08/16 1145 06/08/16  1200 06/08/16 1215  BP: 153/97 153/93 128/86   Pulse: 95 96 91 93  Resp: 20  19 20   Temp: 98.2 F (36.8 C)     TempSrc: Oral     SpO2: 99% 97% 97% 96%  Weight: 79.4 kg     Height: 5\' 10"  (1.778 m)      Vitals:   06/08/16 1245 06/08/16 1258 06/08/16 1300 06/08/16 1315  BP:  129/93 103/92   Pulse: 85 85 85 86  Resp:      Temp:      TempSrc:      SpO2: 98% 99% 97% 100%  Weight:      Height:          Final Clinical Impressions(s) / ED Diagnoses   Final diagnoses:  Gastroesophageal reflux disease, esophagitis presence not specified    New Prescriptions Discharge Medication List as of 06/08/2016  1:04 PM       Anselm Pancoast, PA-C 06/08/16 1552    Gwyneth Sprout, MD 06/09/16 [phone]

## 2016-06-08 NOTE — Discharge Instructions (Signed)
You have been seen today for epigastric discomfort. Your imaging and lab tests showed no acute abnormalities. Follow up with PCP as soon as possible for reassessment and chronic management of this issue. Return to ED should symptoms worsen.  Continue taking the Protonix and your other medications as prescribed.  Recommend limiting alcohol intake to less than 6 drinks per week.

## 2016-06-23 ENCOUNTER — Encounter (HOSPITAL_BASED_OUTPATIENT_CLINIC_OR_DEPARTMENT_OTHER): Payer: Self-pay | Admitting: *Deleted

## 2016-06-23 ENCOUNTER — Emergency Department (HOSPITAL_BASED_OUTPATIENT_CLINIC_OR_DEPARTMENT_OTHER)
Admission: EM | Admit: 2016-06-23 | Discharge: 2016-06-23 | Disposition: A | Discharge status: Home / Self Care | Payer: Managed Care, Other (non HMO) | Attending: Emergency Medicine | Admitting: Emergency Medicine

## 2016-06-23 DIAGNOSIS — I10 Essential (primary) hypertension: Secondary | ICD-10-CM | POA: Diagnosis not present

## 2016-06-23 DIAGNOSIS — R531 Weakness: Secondary | ICD-10-CM | POA: Diagnosis not present

## 2016-06-23 DIAGNOSIS — R630 Anorexia: Secondary | ICD-10-CM

## 2016-06-23 LAB — CBC WITH DIFFERENTIAL/PLATELET
BASOS ABS: 0 10*3/uL (ref 0.0–0.1)
BASOS PCT: 0 %
EOS ABS: 0.1 10*3/uL (ref 0.0–0.7)
EOS PCT: 2 %
HCT: 43.6 % (ref 39.0–52.0)
Hemoglobin: 15.2 g/dL (ref 13.0–17.0)
LYMPHS PCT: 23 %
Lymphs Abs: 0.7 10*3/uL (ref 0.7–4.0)
MCH: 31 pg (ref 26.0–34.0)
MCHC: 34.9 g/dL (ref 30.0–36.0)
MCV: 88.8 fL (ref 78.0–100.0)
MONO ABS: 0.3 10*3/uL (ref 0.1–1.0)
Monocytes Relative: 9 %
Neutro Abs: 2 10*3/uL (ref 1.7–7.7)
Neutrophils Relative %: 66 %
PLATELETS: 150 10*3/uL (ref 150–400)
RBC: 4.91 MIL/uL (ref 4.22–5.81)
RDW: 13.5 % (ref 11.5–15.5)
WBC: 3 10*3/uL — AB (ref 4.0–10.5)

## 2016-06-23 LAB — URINALYSIS, ROUTINE W REFLEX MICROSCOPIC
Bilirubin Urine: NEGATIVE
GLUCOSE, UA: NEGATIVE mg/dL
HGB URINE DIPSTICK: NEGATIVE
Ketones, ur: 15 mg/dL — AB
Leukocytes, UA: NEGATIVE
Nitrite: NEGATIVE
PH: 6 (ref 5.0–8.0)
PROTEIN: NEGATIVE mg/dL
SPECIFIC GRAVITY, URINE: 1.02 (ref 1.005–1.030)

## 2016-06-23 LAB — COMPREHENSIVE METABOLIC PANEL
ALBUMIN: 4.3 g/dL (ref 3.5–5.0)
ALT: 18 U/L (ref 17–63)
AST: 22 U/L (ref 15–41)
Alkaline Phosphatase: 55 U/L (ref 38–126)
Anion gap: 6 (ref 5–15)
BUN: 16 mg/dL (ref 6–20)
CO2: 26 mmol/L (ref 22–32)
Calcium: 9.3 mg/dL (ref 8.9–10.3)
Chloride: 104 mmol/L (ref 101–111)
Creatinine, Ser: 1.11 mg/dL (ref 0.61–1.24)
GFR calc Af Amer: >60 mL/min (ref >60)
GLUCOSE: 122 mg/dL — AB (ref 65–99)
POTASSIUM: 3.6 mmol/L (ref 3.5–5.1)
SODIUM: 136 mmol/L (ref 135–145)
TOTAL PROTEIN: 7.3 g/dL (ref 6.5–8.1)
Total Bilirubin: 2.3 mg/dL — ABNORMAL HIGH (ref 0.3–1.2)

## 2016-06-23 LAB — TSH: TSH: 0.65 u[IU]/mL (ref 0.350–4.500)

## 2016-06-23 NOTE — ED Triage Notes (Signed)
Pt was seen by his md on Monday for same

## 2016-06-23 NOTE — Discharge Instructions (Signed)
Continue to push fluids and eat. Continue ongoing work-up through your primary care provider. Return for worsening symptoms, including passing out, blood or black stools, intractable vomiting, fever, or any other symptoms concerning to you.

## 2016-06-23 NOTE — ED Provider Notes (Signed)
[facility] Provider Note   CSN: [id number] Arrival date & time: 06/23/16  0865  First Provider Contact:  First MD Initiated Contact with Patient 06/23/16 [phone]        History   Chief Complaint Chief Complaint  Patient presents with  . Weakness    HPI Champion Corales is a 60 y.o. male.  HPI  60 year old male who presents with decreased appetite and generalized weakness.  He has a history of BPH, hypertension, hyperlipidemia and GERD.  States that over the past 2 weeks he has had gradual diminished appetite with generalized weakness.  Was seen initially in the emergency department 2 weeks ago and at that time was also having reflux symptoms. States he has followed up with PCP on Monday and was told that everything was okay. Denies increased stressors or depression. Denies fever, chills, night sweats, abdominal pain, melena or hematochezia, nausea or vomiting, chest pain or difficulty breathing, coughing, syncope or near syncope, dysuria, or urinary frequency.  States that he normally weighs 174 pounds, and on Monday with his primary care doctor he weighed 171 pounds.  States that he was seen by his urologist recently and has had biopsy of prostate in January that was negative for cancer.  He was told by his urologist on Friday that his PSA was slightly down trending and stable.  He has had multiple routine colonoscopies in the past, last 2 years ago that was normal.  No history of tobacco use. Does not drink daily, at most 1-2 times per week.   Past Medical History:  Diagnosis Date  . Elevated PSA   . GERD (gastroesophageal reflux disease)   . High cholesterol   . Hypertension     There are no active problems to display for this patient.   Past Surgical History:  Procedure Laterality Date  . APPENDECTOMY    . BACK SURGERY     two back surgeries to help with sciatica       Home Medications    Prior to Admission medications   Medication Sig Start Date End Date  Taking? Authorizing Provider  metoprolol (TOPROL-XL) 200 MG 24 hr tablet Take 50 mg by mouth daily.     Historical Provider, MD  pantoprazole (PROTONIX) 20 MG tablet Take 2 tablets (40 mg total) by mouth daily. 04/21/13   Elson Areas, PA-C  potassium chloride (K-DUR,KLOR-CON) 10 MEQ tablet Take 10 mEq by mouth daily.    Historical Provider, MD  simvastatin (ZOCOR) 10 MG tablet Take 10 mg by mouth at bedtime.      Historical Provider, MD  triamterene-hydrochlorothiazide (MAXZIDE-25) 37.5-25 MG per tablet Take 1 tablet by mouth daily.    Historical Provider, MD    Family History No family history on file.  Social History Social History  Substance Use Topics  . Smoking status: Never Smoker  . Smokeless tobacco: Never Used  . Alcohol use Yes     Comment: rarely     Allergies   Caffeine; Ciprofloxacin; Famotidine; and Keflex [cephalexin]   Review of Systems Review of Systems 10/14 systems reviewed and are negative other than those stated in the HPI   Physical Exam Updated Vital Signs BP 143/81 (BP Location: Right Arm)   Pulse 86   Temp 98.3 F (36.8 C) (Oral)   Resp 18   Ht  (1.778 m)   Wt 171 lb (77.6 kg)   SpO2 99%   BMI 24.54 kg/m   Physical Exam Physical Exam  Nursing  note and vitals reviewed. Constitutional: Well developed, well nourished, non-toxic, and in no acute distress Head: Normocephalic and atraumatic.  Mouth/Throat: Oropharynx is clear and moist.  Neck: Normal range of motion. Neck supple.  Cardiovascular: Normal rate and regular rhythm.   Pulmonary/Chest: Effort normal and breath sounds normal.  Abdominal: Soft. There is no tenderness. There is no rebound and no guarding.  Musculoskeletal: Normal range of motion.  Neurological: Alert, no facial droop, fluent speech, moves all extremities symmetrically Skin: Skin is warm and dry.  Psychiatric: Cooperative   ED Treatments / Results  Labs (all labs ordered are listed, but only abnormal  results are displayed) Labs Reviewed  CBC WITH DIFFERENTIAL/PLATELET - Abnormal; Notable for the following:       Result Value   WBC 3.0 (*)    All other components within normal limits  COMPREHENSIVE METABOLIC PANEL - Abnormal; Notable for the following:    Glucose, Bld 122 (*)    Total Bilirubin 2.3 (*)    All other components within normal limits  URINALYSIS, ROUTINE W REFLEX MICROSCOPIC (NOT AT [facility]) - Abnormal; Notable for the following:    Color, Urine AMBER (*)    Ketones, ur 15 (*)    All other components within normal limits  TSH    EKG  EKG Interpretation  Date/Time:  Friday June 23 2016 06:55:05 EDT Ventricular Rate:  75 PR Interval:  176 QRS Duration: 86 QT Interval:  368 QTC Calculation: 410 R Axis:   -28 Text Interpretation:  Normal sinus rhythm Normal ECG No significant change since last tracing Confirmed by Akari Defelice MD, Franceen Erisman ([id number]) on 06/23/2016 7:06:07 AM       Radiology No results found.  Procedures Procedures (including critical care time)  Medications Ordered in ED Medications - No data to display   Initial Impression / Assessment and Plan / ED Course  I have reviewed the triage vital signs and the nursing notes.  Pertinent labs & imaging results that were available during my care of the patient were reviewed by me and considered in my medical decision making (see chart for details).  Clinical Course    60 year old male who presents with 2 weeks decreased appetite and weakness. Vital signs non-concerning. He is well appearing and in no acute distress. Normal exam. No major risk factors for malignancy, and with urology and GI screening in the past that has been negative. Normal creatinine. No major metabolic derangements.  No liver dysfunction.  No evidence of an acute anemia and no history of GI bleeding. Thyroid screening sent. Denies depression or mood disorder. At this time, I feel he can continue outpatient w/u with his PCP. The patient appears  reasonably screened and/or stabilized for discharge and I doubt any other medical condition or other [facility] requiring further screening, evaluation, or treatment in the ED at this time prior to discharge. Strict return and follow-up instructions reviewed. He expressed understanding of all discharge instructions and felt comfortable with the plan of care.   Final Clinical Impressions(s) / ED Diagnoses   Final diagnoses:  Weakness  Decreased appetite    New Prescriptions New Prescriptions   No medications on file     Lavera Guiseana Duo Jaylee Lantry, MD 06/23/16 [phone]

## 2016-06-23 NOTE — ED Triage Notes (Signed)
Pt c/o weakness, decreased po intake,  No desire to eat, denies pain

## 2017-01-08 ENCOUNTER — Emergency Department (HOSPITAL_BASED_OUTPATIENT_CLINIC_OR_DEPARTMENT_OTHER)
Admission: EM | Admit: 2017-01-08 | Discharge: 2017-01-08 | Disposition: A | Discharge status: Home / Self Care | Payer: Managed Care, Other (non HMO) | Attending: Emergency Medicine | Admitting: Emergency Medicine

## 2017-01-08 ENCOUNTER — Encounter (HOSPITAL_BASED_OUTPATIENT_CLINIC_OR_DEPARTMENT_OTHER): Payer: Self-pay | Admitting: Emergency Medicine

## 2017-01-08 DIAGNOSIS — Z79899 Other long term (current) drug therapy: Secondary | ICD-10-CM | POA: Insufficient documentation

## 2017-01-08 DIAGNOSIS — I10 Essential (primary) hypertension: Secondary | ICD-10-CM | POA: Diagnosis not present

## 2017-01-08 DIAGNOSIS — R42 Dizziness and giddiness: Secondary | ICD-10-CM | POA: Diagnosis present

## 2017-01-08 LAB — CBC
HCT: 43.8 % (ref 39.0–52.0)
Hemoglobin: 15 g/dL (ref 13.0–17.0)
MCH: 30.6 pg (ref 26.0–34.0)
MCHC: 34.2 g/dL (ref 30.0–36.0)
MCV: 89.4 fL (ref 78.0–100.0)
PLATELETS: 142 10*3/uL — AB (ref 150–400)
RBC: 4.9 MIL/uL (ref 4.22–5.81)
RDW: 14.1 % (ref 11.5–15.5)
WBC: 3.5 10*3/uL — AB (ref 4.0–10.5)

## 2017-01-08 LAB — BASIC METABOLIC PANEL
Anion gap: 4 — ABNORMAL LOW (ref 5–15)
BUN: 16 mg/dL (ref 6–20)
CHLORIDE: 106 mmol/L (ref 101–111)
CO2: 29 mmol/L (ref 22–32)
CREATININE: 1.15 mg/dL (ref 0.61–1.24)
Calcium: 9.3 mg/dL (ref 8.9–10.3)
GFR calc non Af Amer: >60 mL/min (ref >60)
Glucose, Bld: 114 mg/dL — ABNORMAL HIGH (ref 65–99)
POTASSIUM: 4.3 mmol/L (ref 3.5–5.1)
Sodium: 139 mmol/L (ref 135–145)

## 2017-01-08 LAB — CBG MONITORING, ED: Glucose-Capillary: 95 mg/dL (ref 65–99)

## 2017-01-08 NOTE — ED Notes (Signed)
Pt ambulated without difficulty. Denied any dizziness. Findings reported back to EDP.

## 2017-01-08 NOTE — ED Notes (Signed)
ED Provider at bedside. 

## 2017-01-08 NOTE — ED Notes (Signed)
Water given to patient. Pt declined food.

## 2017-01-08 NOTE — ED Provider Notes (Signed)
[facility]   CSN: [id number] Arrival date & time: 01/08/17  4098     History   Chief Complaint Chief Complaint  Patient presents with  . Dizziness    HPI Anne Sebring is a 61 y.o. male.  HPI 61 year old male with a history of hypertension and hyperlipidemia presents with dizziness that occurred around 6:15 AM. He was at work talking to a colleague while standing and felt like everything was spinning. He said he almost immediately sat down and it seemed to resolve. He did not feel like he is going to pass out. There is no headache, blurry vision, or weakness/numbness. No chest pain. Since sitting down the dizziness has not recurred. He has felt shaky and anxious since. Typically over the last 2 or 3 weeks he's been drinking and ensure every day to give him extra energy. He typically doesn't drink it until he is already at work during his break. He did not drink or eat this morning. States he has probably drink less water than he should, especially being on a diuretic for blood pressure. However he has not been sick recently including no vomiting or illness. He checked his blood pressure almost immediately after this and it was moderately elevated at 150 systolic and then trended down into the 140s.  Past Medical History:  Diagnosis Date  . Elevated PSA   . GERD (gastroesophageal reflux disease)   . High cholesterol   . Hypertension     There are no active problems to display for this patient.   Past Surgical History:  Procedure Laterality Date  . APPENDECTOMY    . BACK SURGERY     two back surgeries to help with sciatica       Home Medications    Prior to Admission medications   Medication Sig Start Date End Date Taking? Authorizing Provider  benazepril-hydrochlorthiazide (LOTENSIN HCT) 10-12.5 MG tablet Take 1 tablet by mouth every other day.   Yes Historical Provider, MD  metoprolol (TOPROL-XL) 200 MG 24 hr tablet Take 50 mg by mouth daily.      Historical Provider, MD  pantoprazole (PROTONIX) 20 MG tablet Take 2 tablets (40 mg total) by mouth daily. 04/21/13   Elson Areas, PA-C  potassium chloride (K-DUR,KLOR-CON) 10 MEQ tablet Take 10 mEq by mouth daily.    Historical Provider, MD  simvastatin (ZOCOR) 10 MG tablet Take 10 mg by mouth at bedtime.      Historical Provider, MD  triamterene-hydrochlorothiazide (MAXZIDE-25) 37.5-25 MG per tablet Take 1 tablet by mouth daily.    Historical Provider, MD    Family History No family history on file.  Social History Social History  Substance Use Topics  . Smoking status: Never Smoker  . Smokeless tobacco: Never Used  . Alcohol use Yes     Comment: rarely     Allergies   Caffeine; Ciprofloxacin; Famotidine; and Keflex [cephalexin]   Review of Systems Review of Systems  Constitutional: Negative for fever.  Respiratory: Negative for shortness of breath.   Cardiovascular: Negative for chest pain.  Gastrointestinal: Negative for vomiting.  Neurological: Positive for dizziness. Negative for weakness, numbness and headaches.  All other systems reviewed and are negative.    Physical Exam Updated Vital Signs BP 148/97   Pulse 62   Temp 98.4 F (36.9 C) (Oral)   Resp 23   Ht 5\' 10"  (1.778 m)   Wt 173 lb (78.5 kg)   SpO2 97%   BMI 24.82 kg/m  Physical Exam  Constitutional: He is oriented to person, place, and time. He appears well-developed and well-nourished.  HENT:  Head: Normocephalic and atraumatic.  Right Ear: External ear normal.  Left Ear: External ear normal.  Nose: Nose normal.  Eyes: EOM are normal. Pupils are equal, round, and reactive to light. Right eye exhibits no discharge. Left eye exhibits no discharge.  Neck: Neck supple.  Cardiovascular: Normal rate, regular rhythm and normal heart sounds.   Pulmonary/Chest: Effort normal and breath sounds normal.  Abdominal: Soft. There is no tenderness.  Musculoskeletal: He exhibits no edema.    Neurological: He is alert and oriented to person, place, and time. He displays no tremor.  CN 3-12 grossly intact. 5/5 strength in all 4 extremities. Grossly normal sensation. Normal finger to nose.   Skin: Skin is warm and dry.  Nursing note and vitals reviewed.    ED Treatments / Results  Labs (all labs ordered are listed, but only abnormal results are displayed) Labs Reviewed  BASIC METABOLIC PANEL - Abnormal; Notable for the following:       Result Value   Glucose, Bld 114 (*)    Anion gap 4 (*)    All other components within normal limits  CBC - Abnormal; Notable for the following:    WBC 3.5 (*)    Platelets 142 (*)    All other components within normal limits  CBG MONITORING, ED    EKG  EKG Interpretation  Date/Time:  Monday January 08 2017 07:22:59 EST Ventricular Rate:  71 PR Interval:    QRS Duration: 99 QT Interval:  391 QTC Calculation: 425 R Axis:   -7 Text Interpretation:  Sinus rhythm ST elevation, consider anterior injury Baseline wander in lead(s) V6 ST elevations similar to priors Confirmed by Criss AlvineGOLDSTON MD, Lorin PicketSCOTT ([id number]) on 01/08/2017 7:26:35 AM Also confirmed by Criss AlvineGOLDSTON MD, Starr Urias [phone]), editor Stout CT, Jola BabinskiMarilyn [phone])  on 01/08/2017 7:27:30 AM       Radiology No results found.  Procedures Procedures (including critical care time)  Medications Ordered in ED Medications - No data to display   Initial Impression / Assessment and Plan / ED Course  I have reviewed the triage vital signs and the nursing notes.  Pertinent labs & imaging results that were available during my care of the patient were reviewed by me and considered in my medical decision making (see chart for details).  Clinical Course as of Jan 08 835  Mon Jan 08, 2017  [id number] Patient's exam is benign. Unclear if this was near syncope or a vertigo that was read quick. Is unable to be reproduced currently. He feels a little shaky currently but no significant tremors or neurologic  dysfunction. Probably more likely near-syncope given he has not been eating this morning or drinking is much typically.  [SG]  [id number] Patient remains well. Labs unremarkable and unchanged from baseline. Will ambulate and if no issues will d/c.   [SG]    Clinical Course User Index [SG] Pricilla LovelessScott Shakaya Bhullar, MD    Patient ambulated without difficulty. Exam and workup unremarkable. I think ACS, CVA, etc are very unlikely. Most likely near-syncope given factors (did not eat, poor fluid intake recently, transient symptoms till sitting down) despite the room spinning sensation. F/u with PCP. Discussed return precautions.  Final Clinical Impressions(s) / ED Diagnoses   Final diagnoses:  Dizziness    New Prescriptions New Prescriptions   No medications on file     Pricilla LovelessScott Krista Som, MD 01/08/17 [phone]

## 2017-01-08 NOTE — ED Notes (Signed)
Pt verbalized understanding of discharge instructions and denies any further questions at this time.   

## 2017-01-08 NOTE — ED Triage Notes (Signed)
Pt reports suddenly feeling dizzy and shaky while talking to a coworker. Denies change of LOC, numbness, weakness, slurred speech or other symptoms. Pt states dizziness lasted "just a flash". Pt appears anxious and shaky during triage, but able to answer questions independently.

## 2017-02-01 ENCOUNTER — Emergency Department (HOSPITAL_COMMUNITY)
Admission: EM | Admit: 2017-02-01 | Discharge: 2017-02-01 | Disposition: A | Discharge status: Home / Self Care | Payer: Managed Care, Other (non HMO) | Attending: Emergency Medicine | Admitting: Emergency Medicine

## 2017-02-01 ENCOUNTER — Encounter (HOSPITAL_COMMUNITY): Payer: Self-pay | Admitting: *Deleted

## 2017-02-01 DIAGNOSIS — Z79899 Other long term (current) drug therapy: Secondary | ICD-10-CM | POA: Diagnosis not present

## 2017-02-01 DIAGNOSIS — R5382 Chronic fatigue, unspecified: Secondary | ICD-10-CM | POA: Diagnosis not present

## 2017-02-01 DIAGNOSIS — I10 Essential (primary) hypertension: Secondary | ICD-10-CM | POA: Diagnosis not present

## 2017-02-01 DIAGNOSIS — R531 Weakness: Secondary | ICD-10-CM | POA: Diagnosis present

## 2017-02-01 LAB — CBC WITH DIFFERENTIAL/PLATELET
Basophils Absolute: 0 10*3/uL (ref 0.0–0.1)
Basophils Relative: 0 %
EOS ABS: 0.1 10*3/uL (ref 0.0–0.7)
Eosinophils Relative: 1 %
HCT: 44.3 % (ref 39.0–52.0)
HEMOGLOBIN: 14.9 g/dL (ref 13.0–17.0)
Lymphocytes Relative: 26 %
Lymphs Abs: 1.2 10*3/uL (ref 0.7–4.0)
MCH: 30.2 pg (ref 26.0–34.0)
MCHC: 33.6 g/dL (ref 30.0–36.0)
MCV: 89.9 fL (ref 78.0–100.0)
Monocytes Absolute: 0.5 10*3/uL (ref 0.1–1.0)
Monocytes Relative: 10 %
NEUTROS PCT: 63 %
Neutro Abs: 3 10*3/uL (ref 1.7–7.7)
Platelets: 178 10*3/uL (ref 150–400)
RBC: 4.93 MIL/uL (ref 4.22–5.81)
RDW: 14 % (ref 11.5–15.5)
WBC: 4.8 10*3/uL (ref 4.0–10.5)

## 2017-02-01 LAB — URINALYSIS, ROUTINE W REFLEX MICROSCOPIC
BILIRUBIN URINE: NEGATIVE
GLUCOSE, UA: NEGATIVE mg/dL
KETONES UR: NEGATIVE mg/dL
Leukocytes, UA: NEGATIVE
NITRITE: NEGATIVE
Protein, ur: 30 mg/dL — AB
Specific Gravity, Urine: 1.01 (ref 1.005–1.030)
Squamous Epithelial / LPF: NONE SEEN
pH: 6 (ref 5.0–8.0)

## 2017-02-01 LAB — COMPREHENSIVE METABOLIC PANEL
ALBUMIN: 4.3 g/dL (ref 3.5–5.0)
ALK PHOS: 65 U/L (ref 38–126)
ALT: 26 U/L (ref 17–63)
ANION GAP: 6 (ref 5–15)
AST: 25 U/L (ref 15–41)
BUN: 12 mg/dL (ref 6–20)
CALCIUM: 9.6 mg/dL (ref 8.9–10.3)
CO2: 28 mmol/L (ref 22–32)
Chloride: 104 mmol/L (ref 101–111)
Creatinine, Ser: 1.15 mg/dL (ref 0.61–1.24)
GFR calc Af Amer: >60 mL/min (ref >60)
GFR calc non Af Amer: >60 mL/min (ref >60)
GLUCOSE: 128 mg/dL — AB (ref 65–99)
Potassium: 4 mmol/L (ref 3.5–5.1)
SODIUM: 138 mmol/L (ref 135–145)
Total Bilirubin: 1.6 mg/dL — ABNORMAL HIGH (ref 0.3–1.2)
Total Protein: 7.5 g/dL (ref 6.5–8.1)

## 2017-02-01 LAB — TSH: TSH: 1.065 u[IU]/mL (ref 0.350–4.500)

## 2017-02-01 NOTE — Discharge Instructions (Signed)
Follow up with a family doc and discuss your symptoms.  You may need a further workup.

## 2017-02-01 NOTE — ED Provider Notes (Signed)
[facility]ER[facility]Y DEPT Provider Note   CSN: [id number] Arrival date & time: 02/01/17  1035     History   Chief Complaint Chief Complaint  Patient presents with  . Weakness  . Anorexia    HPI Joel Young is a 61 y.o. male.  61 yo M with a chief complaint of generalized weakness. Patient has had issues with this off and on for at least 8 months. He has been seen 2 times in the ED prior to this one. He also has been [place] He is found to have a low vitamin D level at that visit and was started on supplementation. He said his symptoms get better and get worse over the weeks. This time is gotten worse over the past week or so. Just feels that he is generally tired and weak. Denies focal findings. Also feels that his nerves are on edge. Feels like he's having some twitching.   The history is provided by the patient.  Weakness  Primary symptoms include no focal weakness. Primary symptoms comment: diffuse weakness. This is a chronic problem. The current episode started more than 1 week ago. The problem has been gradually worsening. There was no focality noted. There has been no fever. Pertinent negatives include no shortness of breath, no chest pain, no vomiting, no confusion and no headaches. Associated medical issues do not include trauma.    Past Medical History:  Diagnosis Date  . Elevated PSA   . GERD (gastroesophageal reflux disease)   . High cholesterol   . Hypertension     There are no active problems to display for this patient.   Past Surgical History:  Procedure Laterality Date  . APPENDECTOMY    . BACK SURGERY     two back surgeries to help with sciatica       Home Medications    Prior to Admission medications   Medication Sig Start Date End Date Taking? Authorizing Provider  cholecalciferol (VITAMIN D) 1000 units tablet Take 1,000 Units by mouth daily.   Yes Historical Provider, MD  metoprolol succinate (TOPROL-XL) 50  MG 24 hr tablet Take 50 mg by mouth daily.    Yes Historical Provider, MD  potassium chloride (K-DUR,KLOR-CON) 10 MEQ tablet Take 10 mEq by mouth every other day.    Yes Historical Provider, MD  simvastatin (ZOCOR) 10 MG tablet Take 10 mg by mouth at bedtime.     Yes Historical Provider, MD  triamterene-hydrochlorothiazide (MAXZIDE-25) 37.5-25 MG per tablet Take 0.5 tablets by mouth every other day.    Yes Historical Provider, MD  pantoprazole (PROTONIX) 20 MG tablet Take 2 tablets (40 mg total) by mouth daily. Patient not taking: Reported on 02/01/2017 04/21/13   Elson AreasLeslie K Sofia, PA-C    Family History No family history on file.  Social History Social History  Substance Use Topics  . Smoking status: Never Smoker  . Smokeless tobacco: Never Used  . Alcohol use Yes     Comment: rarely     Allergies   Caffeine; Ciprofloxacin; Famotidine; and Keflex [cephalexin]   Review of Systems Review of Systems  Constitutional: Negative for chills and fever.  HENT: Negative for congestion and facial swelling.   Eyes: Negative for discharge and visual disturbance.  Respiratory: Negative for shortness of breath.   Cardiovascular: Negative for chest pain and palpitations.  Gastrointestinal: Positive for nausea. Negative for abdominal pain, diarrhea and vomiting.  Musculoskeletal: Negative for arthralgias and myalgias.  Skin:  Negative for color change and rash.  Neurological: Positive for weakness (generalized ). Negative for tremors, focal weakness, syncope and headaches.  Psychiatric/Behavioral: Negative for confusion and dysphoric mood.     Physical Exam Updated Vital Signs BP 124/86   Pulse 65   Temp 97.5 F (36.4 C) (Oral)   Resp 11   SpO2 100%   Physical Exam  Constitutional: He is oriented to person, place, and time. He appears well-developed and well-nourished.  HENT:  Head: Normocephalic and atraumatic.  Eyes: EOM are normal. Pupils are equal, round, and reactive to light.    Neck: Normal range of motion. Neck supple. No JVD present.  Cardiovascular: Normal rate and regular rhythm.  Exam reveals no gallop and no friction rub.   No murmur heard. Pulmonary/Chest: No respiratory distress. He has no wheezes.  Abdominal: He exhibits no distension and no mass. There is no tenderness. There is no rebound and no guarding.  Musculoskeletal: Normal range of motion.  Neurological: He is alert and oriented to person, place, and time.  Skin: No rash noted. No pallor.  Psychiatric: He has a normal mood and affect. His behavior is normal.  Nursing note and vitals reviewed.    ED Treatments / Results  Labs (all labs ordered are listed, but only abnormal results are displayed) Labs Reviewed  COMPREHENSIVE METABOLIC PANEL - Abnormal; Notable for the following:       Result Value   Glucose, Bld 128 (*)    Total Bilirubin 1.6 (*)    All other components within normal limits  URINALYSIS, ROUTINE W REFLEX MICROSCOPIC - Abnormal; Notable for the following:    Hgb urine dipstick SMALL (*)    Protein, ur 30 (*)    Bacteria, UA RARE (*)    All other components within normal limits  CBC WITH DIFFERENTIAL/PLATELET  TSH  T4    EKG  EKG Interpretation  Date/Time:  Thursday February 01 2017 11:42:50 EDT Ventricular Rate:  64 PR Interval:    QRS Duration: 97 QT Interval:  388 QTC Calculation: 401 R Axis:   26 Text Interpretation:  Sinus rhythm Consider left atrial enlargement Anteroseptal infarct, old No significant change since last tracing Confirmed by Karizma Cheek MD, DANIEL ([id number]) on 02/01/2017 11:48:38 AM       Radiology No results found.  Procedures Procedures (including critical care time)  Medications Ordered in ED Medications - No data to display   Initial Impression / Assessment and Plan / ED Course  I have reviewed the triage vital signs and the nursing notes.  Pertinent labs & imaging results that were available during my care of the patient were reviewed  by me and considered in my medical decision making (see chart for details).     50 yoM With a chief complaint of generalized weakness. Going on for at least 8 months. Patient has been seen by multiple providers for this. I discussed with the patient the limited utility of emergency department evaluation for generalized weakness and that he likely needs primary care follow-up for further labs and maybe referral.  EKG with no changes from prior. Total bilirubin is elevated but similar to prior visits. Mild hyperglycemia. Will have the patient follow-up with his family physician.  1:05 PM:  I have discussed the diagnosis/risks/treatment options with the patient and family and believe the pt to be eligible for discharge home to follow-up with PCP. We also discussed returning to the ED immediately if new or worsening sx occur. We discussed the sx  which are most concerning (e.g., sudden worsening pain, fever, inability to tolerate by mouth) that necessitate immediate return. Medications administered to the patient during their visit and any new prescriptions provided to the patient are listed below.  Medications given during this visit Medications - No data to display   The patient appears reasonably screen and/or stabilized for discharge and I doubt any other medical condition or other [facility] requiring further screening, evaluation, or treatment in the ED at this time prior to discharge.    Final Clinical Impressions(s) / ED Diagnoses   Final diagnoses:  Chronic fatigue  Hyperbilirubinemia    New Prescriptions New Prescriptions   No medications on file     Melene Plan, DO 02/01/17 1305

## 2017-02-01 NOTE — ED Triage Notes (Signed)
Pt complains of generalized weakness and loss of appetite  for 1 month, which has been worse over the past 3 days. [facility] but does not feel better.

## 2017-02-02 LAB — T4: T4 TOTAL: 6.6 ug/dL (ref 4.5–12.0)

## 2017-03-02 ENCOUNTER — Emergency Department (HOSPITAL_BASED_OUTPATIENT_CLINIC_OR_DEPARTMENT_OTHER)
Admission: EM | Admit: 2017-03-02 | Discharge: 2017-03-02 | Disposition: A | Discharge status: Home / Self Care | Payer: Managed Care, Other (non HMO) | Attending: Emergency Medicine | Admitting: Emergency Medicine

## 2017-03-02 ENCOUNTER — Encounter (HOSPITAL_BASED_OUTPATIENT_CLINIC_OR_DEPARTMENT_OTHER): Payer: Self-pay | Admitting: *Deleted

## 2017-03-02 DIAGNOSIS — R251 Tremor, unspecified: Secondary | ICD-10-CM | POA: Insufficient documentation

## 2017-03-02 DIAGNOSIS — R531 Weakness: Secondary | ICD-10-CM | POA: Diagnosis present

## 2017-03-02 DIAGNOSIS — I10 Essential (primary) hypertension: Secondary | ICD-10-CM | POA: Insufficient documentation

## 2017-03-02 LAB — CBC WITH DIFFERENTIAL/PLATELET
Basophils Absolute: 0 10*3/uL (ref 0.0–0.1)
Basophils Relative: 1 %
Eosinophils Absolute: 0.1 10*3/uL (ref 0.0–0.7)
Eosinophils Relative: 3 %
HCT: 41.7 % (ref 39.0–52.0)
HEMOGLOBIN: 14.6 g/dL (ref 13.0–17.0)
LYMPHS ABS: 1.3 10*3/uL (ref 0.7–4.0)
LYMPHS PCT: 35 %
MCH: 31.2 pg (ref 26.0–34.0)
MCHC: 35 g/dL (ref 30.0–36.0)
MCV: 89.1 fL (ref 78.0–100.0)
MONOS PCT: 12 %
Monocytes Absolute: 0.4 10*3/uL (ref 0.1–1.0)
NEUTROS ABS: 1.9 10*3/uL (ref 1.7–7.7)
NEUTROS PCT: 51 %
Platelets: 161 10*3/uL (ref 150–400)
RBC: 4.68 MIL/uL (ref 4.22–5.81)
RDW: 13.6 % (ref 11.5–15.5)
WBC: 3.8 10*3/uL — AB (ref 4.0–10.5)

## 2017-03-02 LAB — MAGNESIUM: Magnesium: 2.3 mg/dL (ref 1.7–2.4)

## 2017-03-02 LAB — COMPREHENSIVE METABOLIC PANEL
ALK PHOS: 49 U/L (ref 38–126)
ALT: 20 U/L (ref 17–63)
ANION GAP: 9 (ref 5–15)
AST: 21 U/L (ref 15–41)
Albumin: 4 g/dL (ref 3.5–5.0)
BUN: 12 mg/dL (ref 6–20)
CALCIUM: 9.1 mg/dL (ref 8.9–10.3)
CO2: 27 mmol/L (ref 22–32)
CREATININE: 1.02 mg/dL (ref 0.61–1.24)
Chloride: 102 mmol/L (ref 101–111)
Glucose, Bld: 103 mg/dL — ABNORMAL HIGH (ref 65–99)
Potassium: 3.7 mmol/L (ref 3.5–5.1)
SODIUM: 138 mmol/L (ref 135–145)
TOTAL PROTEIN: 6.8 g/dL (ref 6.5–8.1)
Total Bilirubin: 1.5 mg/dL — ABNORMAL HIGH (ref 0.3–1.2)

## 2017-03-02 LAB — URINALYSIS, ROUTINE W REFLEX MICROSCOPIC
BILIRUBIN URINE: NEGATIVE
Glucose, UA: NEGATIVE mg/dL
Hgb urine dipstick: NEGATIVE
Ketones, ur: NEGATIVE mg/dL
Leukocytes, UA: NEGATIVE
NITRITE: NEGATIVE
PH: 6 (ref 5.0–8.0)
Protein, ur: 100 mg/dL — AB
SPECIFIC GRAVITY, URINE: 1.008 (ref 1.005–1.030)

## 2017-03-02 LAB — URINALYSIS, MICROSCOPIC (REFLEX)

## 2017-03-02 NOTE — ED Triage Notes (Signed)
Pt with intermittent generalized weakness and fatigue for months now this episode started 2-3 days ago woke this am with shakiness and anxiety

## 2017-03-02 NOTE — ED Provider Notes (Signed)
[facility] Provider Note: Joel Dell, MD, FACEP  CSN: [id number] MRN: [id number] ARRIVAL: 03/02/17 at 0206 ROOM: [facility]03/[facility]   CHIEF COMPLAINT  Weakness and Tremors   HISTORY OF PRESENT ILLNESS  Joel Young is a 61 y.o. male with a two- to three-day history of tremors and generalized weakness when he gets up in the morning. He feels he is weak all through his body including his muscles and joints. He is also tremulous. He feels he has to hold onto things to keep from falling. The symptoms improve over the next few hours and he returns to his normal level of help later in the day. There are no specific exacerbating or mitigating factors. He has not had a fever. He has been seen for weakness in the past and had thyroid studies in March of this year which were normal. He states the current symptoms are not the same as previously. He has a PCP but not in this area.   Past Medical History:  Diagnosis Date  . Elevated PSA   . GERD (gastroesophageal reflux disease)   . High cholesterol   . Hypertension     Past Surgical History:  Procedure Laterality Date  . APPENDECTOMY    . BACK SURGERY     two back surgeries to help with sciatica    History reviewed. No pertinent family history.  Social History  Substance Use Topics  . Smoking status: Never Smoker  . Smokeless tobacco: Never Used  . Alcohol use Yes     Comment: rarely    Prior to Admission medications   Medication Sig Start Date End Date Taking? Authorizing Provider  cholecalciferol (VITAMIN D) 1000 units tablet Take 1,000 Units by mouth daily.    Historical Provider, MD  metoprolol succinate (TOPROL-XL) 50 MG 24 hr tablet Take 50 mg by mouth daily.     Historical Provider, MD  pantoprazole (PROTONIX) 20 MG tablet Take 2 tablets (40 mg total) by mouth daily. Patient not taking: Reported on 02/01/2017 04/21/13   Elson Areas, PA-C  potassium chloride (K-DUR,KLOR-CON) 10 MEQ tablet Take 10 mEq by mouth every  other day.     Historical Provider, MD  simvastatin (ZOCOR) 10 MG tablet Take 10 mg by mouth at bedtime.      Historical Provider, MD  triamterene-hydrochlorothiazide (MAXZIDE-25) 37.5-25 MG per tablet Take 0.5 tablets by mouth every other day.     Historical Provider, MD    Allergies Caffeine; Ciprofloxacin; Famotidine; and Keflex [cephalexin]   REVIEW OF SYSTEMS  Negative except as noted here or in the History of Present Illness.   PHYSICAL EXAMINATION  Initial Vital Signs Blood pressure 128/81, pulse 75, temperature 98.1 F (36.7 C), temperature source Oral, resp. rate 18, height  (1.778 m), weight 172 lb (78 kg), SpO2 100 %.  Examination General: Well-developed, well-nourished male in no acute distress; appearance consistent with age of record HENT: normocephalic; atraumatic Eyes: pupils equal, round and reactive to light; extraocular muscles intact Neck: supple Heart: regular rate and rhythm Lungs: clear to auscultation bilaterally Abdomen: soft; nondistended; nontender; no masses or hepatosplenomegaly; bowel sounds present Extremities: No deformity; full range of motion; pulses normal Neurologic: Awake, alert and oriented; motor function intact in all extremities and symmetric; no facial droop; normal coordination, speech and gait Skin: Warm and dry Psychiatric: Normal mood and affect   RESULTS  Summary of this visit's results, reviewed by myself:   EKG Interpretation  Date/Time:    Ventricular Rate:  PR Interval:    QRS Duration:   QT Interval:    QTC Calculation:   R Axis:     Text Interpretation:        Laboratory Studies: Results for orders placed or performed during the hospital encounter of 03/02/17 (from the past 24 hour(s))  Urinalysis, Routine w reflex microscopic     Status: Abnormal   Collection Time: 03/02/17  3:06 AM  Result Value Ref Range   Color, Urine YELLOW YELLOW   APPearance CLEAR CLEAR   Specific Gravity, Urine 1.008 1.005 -  1.030   pH 6.0 5.0 - 8.0   Glucose, UA NEGATIVE NEGATIVE mg/dL   Hgb urine dipstick NEGATIVE NEGATIVE   Bilirubin Urine NEGATIVE NEGATIVE   Ketones, ur NEGATIVE NEGATIVE mg/dL   Protein, ur 409 (A) NEGATIVE mg/dL   Nitrite NEGATIVE NEGATIVE   Leukocytes, UA NEGATIVE NEGATIVE  CBC with Differential     Status: Abnormal   Collection Time: 03/02/17  3:06 AM  Result Value Ref Range   WBC 3.8 (L) 4.0 - 10.5 K/uL   RBC 4.68 4.22 - 5.81 MIL/uL   Hemoglobin 14.6 13.0 - 17.0 g/dL   HCT 81.1 91.4 - 78.2 %   MCV 89.1 78.0 - 100.0 fL   MCH 31.2 26.0 - 34.0 pg   MCHC 35.0 30.0 - 36.0 g/dL   RDW 95.6 21.3 - 08.6 %   Platelets 161 150 - 400 K/uL   Neutrophils Relative % 51 %   Neutro Abs 1.9 1.7 - 7.7 K/uL   Lymphocytes Relative 35 %   Lymphs Abs 1.3 0.7 - 4.0 K/uL   Monocytes Relative 12 %   Monocytes Absolute 0.4 0.1 - 1.0 K/uL   Eosinophils Relative 3 %   Eosinophils Absolute 0.1 0.0 - 0.7 K/uL   Basophils Relative 1 %   Basophils Absolute 0.0 0.0 - 0.1 K/uL  Comprehensive metabolic panel     Status: Abnormal   Collection Time: 03/02/17  3:06 AM  Result Value Ref Range   Sodium 138 135 - 145 mmol/L   Potassium 3.7 3.5 - 5.1 mmol/L   Chloride 102 101 - 111 mmol/L   CO2 27 22 - 32 mmol/L   Glucose, Bld 103 (H) 65 - 99 mg/dL   BUN 12 6 - 20 mg/dL   Creatinine, Ser 5.78 0.61 - 1.24 mg/dL   Calcium 9.1 8.9 - 46.9 mg/dL   Total Protein 6.8 6.5 - 8.1 g/dL   Albumin 4.0 3.5 - 5.0 g/dL   AST 21 15 - 41 U/L   ALT 20 17 - 63 U/L   Alkaline Phosphatase 49 38 - 126 U/L   Total Bilirubin 1.5 (H) 0.3 - 1.2 mg/dL   GFR calc non Af Amer >60 >60 mL/min   GFR calc Af Amer >60 >60 mL/min   Anion gap 9 5 - 15  Urinalysis, Microscopic (reflex)     Status: Abnormal   Collection Time: 03/02/17  3:06 AM  Result Value Ref Range   RBC / HPF 0-5 0 - 5 RBC/hpf   WBC, UA 0-5 0 - 5 WBC/hpf   Bacteria, UA RARE (A) NONE SEEN   Squamous Epithelial / LPF 0-5 (A) NONE SEEN  Magnesium     Status: None    Collection Time: 03/02/17  3:06 AM  Result Value Ref Range   Magnesium 2.3 1.7 - 2.4 mg/dL   Imaging Studies: No results found.  ED COURSE  Nursing notes and initial vitals signs, including  pulse oximetry, reviewed.  Vitals:   03/02/17 0211 03/02/17 0213  BP: 128/81   Pulse: 75   Resp: 18   Temp: 98.1 F (36.7 C)   TempSrc: Oral   SpO2: 100%   Weight:  172 lb (78 kg)  Height:   (1.778 m)   4:16 AM The patient's laboratory studies are unremarkable. As noted above he had normal thyroid function less than a month ago. His symptomatology is nonspecific. This could represent an abnormal reaction to his antihypertensives. It could represent a neurologic condition. As noted above he does not have a primary care physician locally and we will provide him referrals for further evaluation.  PROCEDURES    ED DIAGNOSES     ICD-9-CM ICD-10-CM   1. Generalized weakness 780.79 R53.1        Paula Libra, MD 03/02/17 [phone]

## 2017-08-29 ENCOUNTER — Emergency Department (HOSPITAL_BASED_OUTPATIENT_CLINIC_OR_DEPARTMENT_OTHER)
Admission: EM | Admit: 2017-08-29 | Discharge: 2017-08-29 | Disposition: A | Discharge status: Home / Self Care | Payer: Managed Care, Other (non HMO) | Attending: Emergency Medicine | Admitting: Emergency Medicine

## 2017-08-29 ENCOUNTER — Encounter (HOSPITAL_BASED_OUTPATIENT_CLINIC_OR_DEPARTMENT_OTHER): Payer: Self-pay | Admitting: *Deleted

## 2017-08-29 DIAGNOSIS — I10 Essential (primary) hypertension: Secondary | ICD-10-CM | POA: Insufficient documentation

## 2017-08-29 DIAGNOSIS — M791 Myalgia, unspecified site: Secondary | ICD-10-CM | POA: Diagnosis not present

## 2017-08-29 DIAGNOSIS — Z79899 Other long term (current) drug therapy: Secondary | ICD-10-CM | POA: Diagnosis not present

## 2017-08-29 DIAGNOSIS — R531 Weakness: Secondary | ICD-10-CM

## 2017-08-29 LAB — COMPREHENSIVE METABOLIC PANEL
ALBUMIN: 4.4 g/dL (ref 3.5–5.0)
ALK PHOS: 63 U/L (ref 38–126)
ALT: 23 U/L (ref 17–63)
ANION GAP: 6 (ref 5–15)
AST: 22 U/L (ref 15–41)
BUN: 13 mg/dL (ref 6–20)
CALCIUM: 9.6 mg/dL (ref 8.9–10.3)
CO2: 28 mmol/L (ref 22–32)
Chloride: 104 mmol/L (ref 101–111)
Creatinine, Ser: 1.05 mg/dL (ref 0.61–1.24)
GFR calc Af Amer: >60 mL/min (ref >60)
GFR calc non Af Amer: >60 mL/min (ref >60)
GLUCOSE: 87 mg/dL (ref 65–99)
Potassium: 3.5 mmol/L (ref 3.5–5.1)
SODIUM: 138 mmol/L (ref 135–145)
Total Bilirubin: 1.7 mg/dL — ABNORMAL HIGH (ref 0.3–1.2)
Total Protein: 7.5 g/dL (ref 6.5–8.1)

## 2017-08-29 LAB — CBC WITH DIFFERENTIAL/PLATELET
BASOS ABS: 0 10*3/uL (ref 0.0–0.1)
BASOS PCT: 0 %
EOS ABS: 0.1 10*3/uL (ref 0.0–0.7)
Eosinophils Relative: 1 %
HCT: 43.6 % (ref 39.0–52.0)
HEMOGLOBIN: 14.8 g/dL (ref 13.0–17.0)
Lymphocytes Relative: 37 %
Lymphs Abs: 1.7 10*3/uL (ref 0.7–4.0)
MCH: 30.6 pg (ref 26.0–34.0)
MCHC: 33.9 g/dL (ref 30.0–36.0)
MCV: 90.1 fL (ref 78.0–100.0)
Monocytes Absolute: 0.5 10*3/uL (ref 0.1–1.0)
Monocytes Relative: 10 %
NEUTROS PCT: 52 %
Neutro Abs: 2.3 10*3/uL (ref 1.7–7.7)
Platelets: 151 10*3/uL (ref 150–400)
RBC: 4.84 MIL/uL (ref 4.22–5.81)
RDW: 14.1 % (ref 11.5–15.5)
WBC: 4.5 10*3/uL (ref 4.0–10.5)

## 2017-08-29 LAB — URINALYSIS, MICROSCOPIC (REFLEX)

## 2017-08-29 LAB — URINALYSIS, ROUTINE W REFLEX MICROSCOPIC
BILIRUBIN URINE: NEGATIVE
Glucose, UA: NEGATIVE mg/dL
Hgb urine dipstick: NEGATIVE
Ketones, ur: NEGATIVE mg/dL
NITRITE: NEGATIVE
PH: 6 (ref 5.0–8.0)
Protein, ur: NEGATIVE mg/dL

## 2017-08-29 LAB — CK: Total CK: 242 U/L (ref 49–397)

## 2017-08-29 NOTE — ED Provider Notes (Signed)
[facility] EMERGENCY DEPARTMENT Provider Note   CSN: [id number] Arrival date & time: 08/29/17  1517     History   Chief Complaint Chief Complaint  Patient presents with  . Generalized Body Aches    HPI Joel Young is a 61 y.o. male.  Patient with history of emergency department visits for generalized weakness presents with complaint of generalized weakness and muscle pain worse over the past 4 days. Aching pain is worse in his arms, legs, and back. He denies any exertional symptoms. He has had similar problems in the past but has never been given a good reason for why he has these symptoms. Patient was concerned that the Zocor he was taking was causing his symptoms, so he discontinued several days ago. He reports having a PCP appointment for a routine physical in the next week in [place]. Patient denies fever, URI symptoms, nausea, vomiting, diarrhea, constipation, abdominal pain, chest pain, shortness of breath. Patient has urinary hesitancy at times but no dysuria or hematuria. No skin rashes or tick bites. Patient has not done anything exertional recently which would explain his body aches. Patient states that his symptoms are worse during the late morning and early afternoon. He works in the evening and states that this helps distract him from his symptoms. No lightheadedness or syncope reported. No treatments prior to arrival. Patient does have history of hypertension and high cholesterol. States that he has a family physician in [place]. He goes to [place] approximately once a month.      Past Medical History:  Diagnosis Date  . Elevated PSA   . GERD (gastroesophageal reflux disease)   . High cholesterol   . Hypertension     There are no active problems to display for this patient.   Past Surgical History:  Procedure Laterality Date  . APPENDECTOMY    . BACK SURGERY     two back surgeries to help with sciatica       Home Medications    Prior to Admission  medications   Medication Sig Start Date End Date Taking? Authorizing Provider  cholecalciferol (VITAMIN D) 1000 units tablet Take 1,000 Units by mouth daily.   Yes [provider]  metoprolol succinate (TOPROL-XL) 50 MG 24 hr tablet Take 50 mg by mouth daily.    Yes [provider]  potassium chloride (K-DUR,KLOR-CON) 10 MEQ tablet Take 10 mEq by mouth every other day.    Yes [provider]  simvastatin (ZOCOR) 10 MG tablet Take 10 mg by mouth at bedtime.     Yes [provider]  triamterene-hydrochlorothiazide (MAXZIDE-25) 37.5-25 MG per tablet Take 0.5 tablets by mouth every other day.    Yes [provider]  pantoprazole (PROTONIX) 20 MG tablet Take 2 tablets (40 mg total) by mouth daily. Patient not taking: Reported on 02/01/2017 04/21/13   Osie Cheeks    Family History No family history on file.  Social History Social History  Substance Use Topics  . Smoking status: Never Smoker  . Smokeless tobacco: Never Used  . Alcohol use No     Allergies   Caffeine; Ciprofloxacin; Famotidine; and Keflex [cephalexin]   Review of Systems Review of Systems  Constitutional: Positive for fatigue. Negative for fever.  HENT: Negative for rhinorrhea and sore throat.   Eyes: Negative for redness.  Respiratory: Negative for cough.   Cardiovascular: Negative for chest pain.  Gastrointestinal: Negative for abdominal pain, blood in stool, diarrhea, nausea and vomiting.  Genitourinary: Negative  for dysuria.  Musculoskeletal: Positive for myalgias.  Skin: Negative for rash.  Neurological: Positive for weakness (generalized). Negative for headaches.     Physical Exam Updated Vital Signs BP 124/85 (BP Location: Right Arm)   Pulse (!) 58   Temp 98.1 F (36.7 C) (Oral)   Resp 18   Ht 5\' 10"  (1.778 m)   Wt 74.8 kg (165 lb)   SpO2 100%   BMI 23.68 kg/m   Physical Exam  Constitutional: He appears well-developed and well-nourished.    HENT:  Head: Normocephalic and atraumatic.  Mouth/Throat: Oropharynx is clear and moist.  Eyes: Conjunctivae are normal. Right eye exhibits no discharge. Left eye exhibits no discharge.  Neck: Normal range of motion. Neck supple.  Cardiovascular: Normal rate, regular rhythm and normal heart sounds.   Pulmonary/Chest: Effort normal and breath sounds normal. No respiratory distress. He has no wheezes. He has no rales.  Abdominal: Soft. There is no tenderness. There is no guarding.  Musculoskeletal: Normal range of motion. He exhibits tenderness (generalized, non-focal). He exhibits no edema.  Neurological: He is alert.  Skin: Skin is warm and dry.  Psychiatric: He has a normal mood and affect.  Nursing note and vitals reviewed.    ED Treatments / Results  Labs (all labs ordered are listed, but only abnormal results are displayed) Labs Reviewed  COMPREHENSIVE METABOLIC PANEL - Abnormal; Notable for the following:       Result Value   Total Bilirubin 1.7 (*)    All other components within normal limits  URINALYSIS, ROUTINE W REFLEX MICROSCOPIC - Abnormal; Notable for the following:    Specific Gravity, Urine <1.005 (*)    Leukocytes, UA TRACE (*)    All other components within normal limits  URINALYSIS, MICROSCOPIC (REFLEX) - Abnormal; Notable for the following:    Bacteria, UA RARE (*)    Squamous Epithelial / LPF 0-5 (*)    All other components within normal limits  CBC WITH DIFFERENTIAL/PLATELET  CK    EKG  EKG Interpretation  Date/Time:  Wednesday August 29 2017 15:48:07 EDT Ventricular Rate:  60 PR Interval:    QRS Duration: 92 QT Interval:  397 QTC Calculation: 397 R Axis:   17 Text Interpretation:  Sinus rhythm Probable anteroseptal infarct, old similar to previous EKG 02/01/2017 Confirmed by Crista Curb [phone]) on 08/29/2017 3:51:06 PM Also confirmed by Crista Curb [phone]), editor Barbette Hair [phone])  on 08/29/2017 4:20:25 PM       Radiology No results  found.  Procedures Procedures (including critical care time)  Medications Ordered in ED Medications - No data to display   Initial Impression / Assessment and Plan / ED Course  I have reviewed the triage vital signs and the nursing notes.  Pertinent labs & imaging results that were available during my care of the patient were reviewed by me and considered in my medical decision making (see chart for details).     Patient seen and examined. Work-up initiated.   Vital signs reviewed and are as follows: BP 124/85 (BP Location: Right Arm)   Pulse (!) 58   Temp 98.1 F (36.7 C) (Oral)   Resp 18   Ht 5\' 10"  (1.778 m)   Wt 74.8 kg (165 lb)   SpO2 100%   BMI 23.68 kg/m   Will check basic screening tests. Discussed with patient that if these are negative, he will need to follow-up with his primary care physician.  4:14 PM EKG unchanged from previous.  CBC without anemia, CK normal.  Patient provided his lab work. He states he will follow-up with his primary care doctor next week. He requests referral information for physician here close to where he is currently living.  Encouraged patient to return with worsening symptoms, fevers, chest pain, shortness of breath.  Patient counseled to return if they have weakness in their arms or legs, slurred speech, trouble walking or talking, confusion, trouble with their balance, or if they have any other concerns. Patient verbalizes understanding and agrees with plan.    Final Clinical Impressions(s) / ED Diagnoses   Final diagnoses:  Myalgia  Generalized weakness   Patient with nonspecific symptoms of muscle aches and pains, generalized weakness. Patient has a normal neurological exam here. No infectious findings on exam or lab workup. Patient was concerned that his symptoms were being caused by his cholesterol medication. CK is normal here. No signs of rhabdomyolysis. He has discontinued. I've encouraged him to follow-up with his PCP  regarding other medications for control of high cholesterol. At this point, no concerning findings requiring admission revealed patient can be discharged home with outpatient follow-up as above.  New Prescriptions Current Discharge Medication List       Renne CriglerGeiple, Trypp Heckmann, Cordelia Poche-C 08/29/17 1634    Lavera GuiseLiu, Dana Duo, MD 08/29/17 [phone]

## 2017-08-29 NOTE — Discharge Instructions (Signed)
Please read and follow all provided instructions.  Your diagnoses today include:  1. Myalgia   2. Generalized weakness     Tests performed today include:  Blood counts and electrolytes  Liver and kidney function test  Test for muscle breakdown  EKG - not changed from previous  Vital signs. See below for your results today.   Medications prescribed:   None  Take any prescribed medications only as directed.  Home care instructions:  Follow any educational materials contained in this packet.  Follow-up instructions: Please follow-up with your primary care provider in the next 7 days for further evaluation of your symptoms.   Return instructions:   Please return to the Emergency Department if you experience worsening symptoms.  Return with chest pain, shortness of breath, fevers   Please return if you have any other emergent concerns.  Additional Information:  Your vital signs today were: BP 124/85 (BP Location: Right Arm)    Pulse (!) 58    Temp 98.1 F (36.7 C) (Oral)    Resp 18    Ht 5\' 10"  (1.778 m)    Wt 71.8 kg (158 lb 4.6 oz)    SpO2 100%    BMI 22.71 kg/m  If your blood pressure (BP) was elevated above 135/85 this visit, please have this repeated by your doctor within one month. --------------

## 2017-08-29 NOTE — ED Triage Notes (Addendum)
Pt reports generalized body aches and fatigue since this past Monday. Denies fever, n/v/d. Denies cough/cold symptoms.

## 2017-08-30 ENCOUNTER — Encounter (HOSPITAL_BASED_OUTPATIENT_CLINIC_OR_DEPARTMENT_OTHER): Payer: Self-pay | Admitting: *Deleted

## 2017-08-30 ENCOUNTER — Emergency Department (HOSPITAL_BASED_OUTPATIENT_CLINIC_OR_DEPARTMENT_OTHER)
Admission: EM | Admit: 2017-08-30 | Discharge: 2017-08-30 | Disposition: A | Discharge status: Home / Self Care | Payer: Managed Care, Other (non HMO) | Attending: Emergency Medicine | Admitting: Emergency Medicine

## 2017-08-30 DIAGNOSIS — I10 Essential (primary) hypertension: Secondary | ICD-10-CM | POA: Diagnosis not present

## 2017-08-30 DIAGNOSIS — M791 Myalgia, unspecified site: Secondary | ICD-10-CM

## 2017-08-30 DIAGNOSIS — R531 Weakness: Secondary | ICD-10-CM | POA: Diagnosis present

## 2017-08-30 DIAGNOSIS — Z79899 Other long term (current) drug therapy: Secondary | ICD-10-CM | POA: Insufficient documentation

## 2017-08-30 NOTE — ED Notes (Signed)
Pt verbalizes understanding of d/c instructions and denies any further needs at this time. 

## 2017-08-30 NOTE — ED Triage Notes (Signed)
Generalized body aches. States he was here for same yesterday and feels worse today. No distress at triage. VS stable.

## 2017-08-30 NOTE — ED Provider Notes (Signed)
[facility] EMERGENCY DEPARTMENT Provider Note   CSN: [id number] Arrival date & time: 08/30/17  1140     History   Chief Complaint Chief Complaint  Patient presents with  . Generalized Body Aches    HPI Joel Young is a 61 y.o. male.  HPI   Patient is a 84o-year-old male with a history of high cholesterol, GERD, elevated PSA, and hypertension presenting for a 4 day history of generalized muscular weakness, particular affecting the lower extremities. Patient was evaluated yesterday but this episode was prompted by some lower extremity weakness that caused him to steady himself or making breakfast this morning. Additionally, patient reports that he has had minimal appetite over this interval. Patient reports that he has some atraumatic joint pain in bilateral knees and elbows, with right knee more affected than left knee. No swelling or erythema these joints per patient. Patient has not had any travel outside of the country. Patient does not have a preceding diarrhea or upper respiratory illness. Patient denies any recent tick bite exposure.  Patient reports that he has not had any systemic symptoms such a rhinorrhea, cough, congestion, fever, chills, nausea, vomiting, or abdominal pain over this interval. Patient denies any numbness affecting one side of the body, or disproportionate weakness between left and right extremities. Patient denies any syncopal episodes or dizziness or vertiginous symptoms. Patient reports that he occasionally becomes lightheaded while standing over this 4-day interval. Patient recently stopped his Zocor due to concerns that it was causing myalgias.  On chart review, patient was evaluated on 08/29/17 for these complaints. Workup at this time consisted of CMP, CBC, CK, and UA. Only abnormality identified is chronic hyperbilirubinemia.  Past Medical History:  Diagnosis Date  . Elevated PSA   . GERD (gastroesophageal reflux disease)   . High  cholesterol   . Hypertension     There are no active problems to display for this patient.   Past Surgical History:  Procedure Laterality Date  . APPENDECTOMY    . BACK SURGERY     two back surgeries to help with sciatica       Home Medications    Prior to Admission medications   Medication Sig Start Date End Date Taking? Authorizing Provider  cholecalciferol (VITAMIN D) 1000 units tablet Take 1,000 Units by mouth daily.    [provider]  metoprolol succinate (TOPROL-XL) 50 MG 24 hr tablet Take 50 mg by mouth daily.     [provider]  potassium chloride (K-DUR,KLOR-CON) 10 MEQ tablet Take 10 mEq by mouth every other day.     [provider]  triamterene-hydrochlorothiazide (MAXZIDE-25) 37.5-25 MG per tablet Take 0.5 tablets by mouth every other day.     [provider]    Family History No family history on file.  Social History Social History  Substance Use Topics  . Smoking status: Never Smoker  . Smokeless tobacco: Never Used  . Alcohol use No     Allergies   Caffeine; Ciprofloxacin; Famotidine; and Keflex [cephalexin]   Review of Systems Review of Systems  Constitutional: Negative for chills and fever.  HENT: Negative for congestion, rhinorrhea, sinus pain and sore throat.   Respiratory: Negative for cough, chest tightness and shortness of breath.   Cardiovascular: Negative for chest pain and leg swelling.  Gastrointestinal: Negative for abdominal pain, diarrhea, nausea and vomiting.  Musculoskeletal: Positive for arthralgias and myalgias. Negative for back pain and joint swelling.  Skin: Negative for rash.  Neurological: Positive for  light-headedness. Negative for dizziness, syncope and headaches.     Physical Exam Updated Vital Signs BP 121/76   Pulse 68   Temp (!) 97.5 F (36.4 C) (Oral)   Resp 20   Ht 5\' 10"  (1.778 m)   Wt 71.7 kg (158 lb)   SpO2 100%   BMI 22.67 kg/m   Physical Exam  Constitutional:  He appears well-developed and well-nourished. No distress.  HENT:  Head: Normocephalic and atraumatic.  Mouth/Throat: Oropharynx is clear and moist.  Eyes: Pupils are equal, round, and reactive to light. Conjunctivae and EOM are normal.  Neck: Normal range of motion. Neck supple.  Cardiovascular: Normal rate, regular rhythm, S1 normal and S2 normal.   No murmur heard. Pulmonary/Chest: Effort normal and breath sounds normal. He has no wheezes. He has no rales.  Abdominal: Soft. He exhibits no distension. There is no tenderness. There is no guarding.  Musculoskeletal: Normal range of motion. He exhibits no edema or deformity.  Tenderness to palpation of musculature around bliateral elbows.  No erythema or edema of bilateral elbows or knees. Patient has full active range of motion of bilateral elbows and knees.  Lymphadenopathy:    He has no cervical adenopathy.  Neurological: He is alert.  Mental Status:  Alert, oriented, thought content appropriate, able to give a coherent history. Speech fluent without evidence of aphasia. Able to follow 2 step commands without difficulty.  Cranial Nerves:  II:  Peripheral visual fields grossly normal, pupils equal, round, reactive to light III,IV, VI: ptosis not present, extra-ocular motions intact bilaterally  V,VII: smile symmetric, facial light touch sensation equal VIII: hearing grossly normal to voice  X: uvula elevates symmetrically  XI: bilateral shoulder shrug symmetric and strong XII: midline tongue extension without fassiculations Motor:  Normal tone. 5/5 in upper and lower extremities bilaterally including strong and equal grip strength and dorsiflexion/plantar flexion Sensory: Pinprick and light touch normal in all extremities.  Deep Tendon Reflexes: 2+ and symmetric in the biceps and patella. No clonus. Cerebellar: normal finger-to-nose with bilateral upper extremities Gait: normal gait and balance Stance: No pronator drift and good  coordination, strength, and position sense with tapping of bilateral arms (performed in sitting position). CV: distal pulses palpable throughout   Skin: Skin is warm and dry. No rash noted. No erythema.  Psychiatric: He has a normal mood and affect. His behavior is normal. Judgment and thought content normal.  Nursing note and vitals reviewed.    ED Treatments / Results  Labs (all labs ordered are listed, but only abnormal results are displayed) Labs Reviewed - No data to display  EKG  EKG Interpretation None       Radiology No results found.  Procedures Procedures (including critical care time)  Medications Ordered in ED Medications - No data to display   Initial Impression / Assessment and Plan / ED Course  I have reviewed the triage vital signs and the nursing notes.  Pertinent labs & imaging results that were available during my care of the patient were reviewed by me and considered in my medical decision making (see chart for details).      Final Clinical Impressions(s) / ED Diagnoses   Final diagnoses:  None   Differential diagnosis includes polymyalgia rheumatica, polymyositis, inflammatory arthritis, general viral illness, AIDP. Patient stated he stopped his Zocor at onset of this illness. Workup performed yesterday was reassuring and shows no elevation of CK and no leukocytosis indicative of inflammatory process. Do not suspect AIDP at this  time, as patient has had unchanging course of weakness in distal lower extremities, and joint pain was concomitant with patient's weakness. Patient has no recent upper respiratory or diarrheal illnesses. Patient has demonstrated no episode of respiratory compromise or ascending paralysis. Patient has not had a headache to suggest other causes of joint pain such as infectious processes of Lyme disease or [place] spotted fever. With the pattern of patient's generalized weakness and joint pain, and patient's age, patient is  also at risk for inflammatory conditions such as polymyalgia rheumatica. Patient will establish follow-up with primary care provider to obtain outpatient workup. o further emergency department workup at this time indicated. Patient is in understanding and agrees with the plan of care.  This is a shared visit with Dr. Drema Pry. Patient was independently evaluated by this attending physician. Attending physician consulted in evaluation and discharge management.  New Prescriptions New Prescriptions   No medications on file     Delia Chimes 08/30/17 1750    Nira Conn, MD 09/02/17 [phone]

## 2017-08-30 NOTE — Discharge Instructions (Signed)
Please see the information and instructions below regarding your visit.  Your diagnoses today include:  1. Myalgia    Your exam is reassuring today. I reviewed all of your lab work from yesterday's visit and it was reassuring that there is no acute finding to cause for your muscle weakness and joint pain. There are many different causes of joint pain and muscle weakness. These include inflammatory processes such as polymyalgia rheumatica or myositis. These may require outpatient labs potentially if that is determined by her primary care provider.  Tests performed today include: See side panel of your discharge paperwork for testing performed today. Vital signs are listed at the bottom of these instructions.   Medications prescribed:    Take any prescribed medications only as prescribed, and any over the counter medications only as directed on the packaging.  Home care instructions:  Please follow any educational materials contained in this packet.   Follow-up instructions: Please follow-up with your primary care provider as soon as possible for further evaluation of your symptoms if they are not completely improved.   Return instructions:  Please return to the Emergency Department if you experience worsening symptoms.  Please return if your weakness starts to extend higher up your legs, if you've fever or chills with your symptoms, you get any additional neurologic symptoms such as numbness in your extremities, or you if you have any passing out spells. Please return for any chest pain or shortness of breath. Please return if you have any other emergent concerns.  Additional Information:   Your vital signs today were: BP 118/73 (BP Location: Right Arm)    Pulse 62    Temp (!) 97.5 F (36.4 C) (Oral)    Resp 18    Ht 5\' 10"  (1.778 m)    Wt 71.7 kg (158 lb)    SpO2 99%    BMI 22.67 kg/m  If your blood pressure (BP) was elevated on multiple readings during this visit above 130 for the  top number or above 80 for the bottom number, please have this repeated by your primary care provider within one month. --------------  Thank you for allowing us to participate in your care today.

## 2017-09-19 ENCOUNTER — Encounter: Payer: Self-pay | Admitting: Family Medicine

## 2017-09-19 ENCOUNTER — Ambulatory Visit (INDEPENDENT_AMBULATORY_CARE_PROVIDER_SITE_OTHER): Payer: Managed Care, Other (non HMO) | Admitting: Family Medicine

## 2017-09-19 VITALS — BP 118/74 | HR 68 | Temp 98.2°F | Ht 70.0 in | Wt 168.1 lb

## 2017-09-19 DIAGNOSIS — I10 Essential (primary) hypertension: Secondary | ICD-10-CM | POA: Diagnosis not present

## 2017-09-19 DIAGNOSIS — L738 Other specified follicular disorders: Secondary | ICD-10-CM

## 2017-09-19 NOTE — Progress Notes (Signed)
Chief Complaint  Patient presents with  . Establish Care       New Patient Visit SUBJECTIVE: HPI: Joel Young is an 61 y.o.male who is being seen for establishing care.  The patient was [facility]  Hypertension Patient presents for hypertension follow up. He does monitor home blood pressures. Blood pressures ranging on average from 120's/70-80's. He is compliant with medications. Patient has these side effects of medication: none He is adhering to a healthy diet overall. Exercise: walking  Has bumps on his neck after shaving. Mildly uncomfortable, no drainage, fevers, redness.  [facility] >[facility], [facility].    Allergies  Allergen Reactions  . Caffeine Other (See Comments)    tremors  . Ciprofloxacin   . Famotidine     Causes heart to race  . Keflex [Cephalexin] Hives    Past Medical History:  Diagnosis Date  . Elevated PSA   . GERD (gastroesophageal reflux disease)   . High cholesterol   . Hypertension    Past Surgical History:  Procedure Laterality Date  . APPENDECTOMY    . BACK SURGERY     two back surgeries to help with sciatica   Social History   Socioeconomic History  . Marital status: Married  Tobacco Use  . Smoking status: Never Smoker  . Smokeless tobacco: Never Used  Substance and Sexual Activity  . Alcohol use: No  . Drug use: No   Family History  Problem Relation Age of Onset  . Prostate cancer Father [phone]     Current Outpatient Medications:  .  cholecalciferol (VITAMIN D) 1000 units tablet, Take 1,000 Units by mouth daily., Disp: , Rfl:  .  metoprolol succinate (TOPROL-XL) 50 MG 24 hr tablet, Take 50 mg by mouth daily. , Disp: , Rfl:  .  potassium chloride (K-DUR,KLOR-CON) 10 MEQ tablet, Take 10 mEq by mouth every other day. , Disp: , Rfl:  .  triamterene-hydrochlorothiazide (MAXZIDE-25) 37.5-25 MG per tablet, Take 0.5 tablets by mouth every other day.  , Disp: , Rfl:   ROS Cardiovascular: Denies chest pain  Respiratory: Denies dyspnea   OBJECTIVE: BP 118/74 (BP Location: Left Arm, Patient Position: Sitting, Cuff Size: Normal)   Pulse 68   Temp 98.2 F (36.8 C) (Oral)   Ht 5\' 10"  (1.778 m)   Wt 168 lb 2 oz (76.3 kg)   SpO2 96%   BMI 24.12 kg/m   Constitutional: -  VS reviewed -  Well developed, well nourished, appears stated age -  No apparent distress  Psychiatric: -  Oriented to person, place, and time -  Memory intact -  Affect and mood normal -  Fluent conversation, good eye contact -  Judgment and insight age appropriate  Eye: -  Conjunctivae clear, no discharge -  Pupils symmetric, round, reactive to light  ENMT: -  MMM    Pharynx moist, no exudate, no erythema  Cardiovascular: -  RRR -  No bruits -  No LE edema  Respiratory: -  Normal respiratory effort, no accessory muscle use, no retraction -  Breath sounds equal, no wheezes, no ronchi, no crackles  Musculoskeletal: -  No clubbing, no cyanosis -  Gait normal  Skin: -  Papules centering around follicle appreciated on neck over beard distribution, no drainage, fluctuance, erythema, excessive warmth -  Warm and dry to palpation   ASSESSMENT/PLAN: Essential hypertension  Folliculitis barbae  Cont current HTN regimen.  TAO for neck, preventative measures discussed.  Patient instructed to sign release of records form from his previous PCP. It sounds like we will share responsibilities regarding this patient's care as [place] [facility] and he works here. If he transitions to down here on a more permanent basis, I will see him bi-yearly or prn. Patient should return prn. The patient voiced understanding and agreement to the plan.   Jilda Rocheicholas Paul [place], DO 09/19/17  11:54 AM

## 2017-09-19 NOTE — Patient Instructions (Signed)
For the neck, OK to put triple antibiotic ointment (Neosporin) over the areas on your neck twice daily for 10 days. Prevention is the best treatment. Good hygiene and shaving with the grain can help.  Let us know if you need anything.

## 2017-09-19 NOTE — Progress Notes (Signed)
Pre visit review using our clinic review tool, if applicable. No additional management support is needed unless otherwise documented below in the visit note. 

## 2017-09-21 ENCOUNTER — Telehealth: Payer: Self-pay | Admitting: *Deleted

## 2017-09-21 NOTE — Telephone Encounter (Signed)
[facility]; forwarded to provider/SLS 11/09

## 2017-12-20 ENCOUNTER — Ambulatory Visit (INDEPENDENT_AMBULATORY_CARE_PROVIDER_SITE_OTHER): Payer: Managed Care, Other (non HMO) | Admitting: Medical

## 2017-12-20 ENCOUNTER — Encounter: Payer: Self-pay | Admitting: Medical

## 2017-12-20 ENCOUNTER — Other Ambulatory Visit: Payer: Self-pay

## 2017-12-20 ENCOUNTER — Ambulatory Visit (HOSPITAL_BASED_OUTPATIENT_CLINIC_OR_DEPARTMENT_OTHER)
Admission: RE | Admit: 2017-12-20 | Discharge: 2017-12-20 | Disposition: A | Discharge status: Home / Self Care | Payer: Managed Care, Other (non HMO) | Source: Ambulatory Visit | Attending: Medical | Admitting: Medical

## 2017-12-20 ENCOUNTER — Telehealth: Payer: Self-pay | Admitting: Medical

## 2017-12-20 VITALS — BP 121/81 | HR 65 | Temp 97.7°F | Resp 16 | Ht 70.0 in | Wt 174.4 lb

## 2017-12-20 DIAGNOSIS — M5136 Other intervertebral disc degeneration, lumbar region: Secondary | ICD-10-CM | POA: Insufficient documentation

## 2017-12-20 DIAGNOSIS — M545 Low back pain, unspecified: Secondary | ICD-10-CM

## 2017-12-20 DIAGNOSIS — G8929 Other chronic pain: Secondary | ICD-10-CM

## 2017-12-20 MED ORDER — DICLOFENAC SODIUM 75 MG PO TBEC: 75.0000 mg | DELAYED_RELEASE_TABLET | Freq: Two times a day (BID) | ORAL | 0 refills | Status: DC

## 2017-12-20 MED ORDER — CYCLOBENZAPRINE HCL 10 MG PO TABS: 10.0000 mg | ORAL_TABLET | Freq: Every day | ORAL | 0 refills | Status: DC

## 2017-12-20 NOTE — Progress Notes (Signed)
Subjective:    Patient ID: Joel Young, male    DOB: [birth year], 62 y.o.   MRN: [id number]  HPI  Pt states yesterday at 7 pm he started to have pain in back. No fall or injury. He works 8 hour shifts. 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  Pt states history of 2 back surgeries in the past. He states hx of bulging disc and pinched nerve.  [place] Pt states estimated about one flare of back pain every 3 months.  Just states if he sits and adjusts can find comfortable position.   Pt has taken ibuprofen yesterday and this morning. It did help some.  No radiating pain to legs, no saddle anesthesia and no incontinence.   Pain worst when stands and changes position.(Describes moderate to severe type pain.)    Review of Systems  Constitutional: Negative for chills, fatigue and fever.  Respiratory: Negative for cough, chest tightness, shortness of breath and wheezing.   Cardiovascular: Negative for chest pain and palpitations.  Gastrointestinal: Negative for abdominal distention, blood in stool, constipation, diarrhea and vomiting.  Musculoskeletal: Positive for back pain.  Skin: Negative for rash.  Neurological: Negative for seizures, facial asymmetry, speech difficulty, weakness and light-headedness.  Hematological: Negative for adenopathy. Does not bruise/bleed easily.  Psychiatric/Behavioral: Negative for agitation, behavioral problems and sleep disturbance. The patient is not nervous/anxious.    Past Medical History:  Diagnosis Date  . Elevated PSA   . GERD (gastroesophageal reflux disease)   . High cholesterol   . Hypertension      Social History   Socioeconomic History  . Marital status: Married    Spouse name: Not on file  . Number of children: Not on file  . Years of education: Not on file  . Highest education level: Not on file  Social Needs  . Financial resource strain: Not on file  . Food insecurity - worry: Not on  file  . Food insecurity - inability: Not on file  . Transportation needs - medical: Not on file  . Transportation needs - non-medical: Not on file  Occupational History  . Not on file  Tobacco Use  . Smoking status: Never Smoker  . Smokeless tobacco: Never Used  Substance and Sexual Activity  . Alcohol use: No  . Drug use: No  . Sexual activity: Not on file  Other Topics Concern  . Not on file  Social History Narrative  . Not on file    Past Surgical History:  Procedure Laterality Date  . APPENDECTOMY    . BACK SURGERY     two back surgeries to help with sciatica    Family History  Problem Relation Age of Onset  . Prostate cancer Father [phone]    Allergies  Allergen Reactions  . Caffeine Other (See Comments)    tremors  . Ciprofloxacin   . Famotidine     Causes heart to race  . Keflex [Cephalexin] Hives    Current Outpatient Medications on File Prior to Visit  Medication Sig Dispense Refill  . cholecalciferol (VITAMIN D) 1000 units tablet Take 1,000 Units by mouth daily.    . finasteride (PROSCAR) 5 MG tablet     . metoprolol succinate (TOPROL-XL) 50 MG 24 hr tablet Take 50 mg by mouth daily.     . potassium chloride (K-DUR,KLOR-CON) 10 MEQ tablet Take 10 mEq by mouth every other day.     . rosuvastatin (CRESTOR)  5 MG tablet     . triamterene-hydrochlorothiazide (MAXZIDE-25) 37.5-25 MG per tablet Take 0.5 tablets by mouth every other day.      No current facility-administered medications on file prior to visit.     BP 121/81 (BP Location: Left Arm, Patient Position: Sitting, Cuff Size: Large)   Pulse 65   Temp 97.7 F (36.5 C) (Oral)   Resp 16   Ht 5\' 10"  (1.778 m)   Wt 174 lb 6.4 oz (79.1 kg)   SpO2 100%   BMI 25.02 kg/m       Objective:   Physical Exam   General Appearance- Not in acute distress.    Chest and Lung Exam Auscultation: Breath sounds:-Normal. Clear even and unlabored. Adventitious sounds:- No Adventitious  sounds.  Cardiovascular Auscultation:Rythm - Regular, rate and rythm. Heart Sounds -Normal heart sounds.  Abdomen Inspection:-Inspection Normal.  Palpation/Perucssion: Palpation and Percussion of the abdomen reveal- Non Tender, No Rebound tenderness, No rigidity(Guarding) and No Palpable abdominal masses.  Liver:-Normal.  Spleen:- Normal.   Back Mid lumbar spine tenderness to palpation and some bilateral SI region tenderness to palpation Pain on straight leg lift. Pain on lateral movements and flexion/extension of the spine.  Lower ext neurologic  L5-S1 sensation intact bilaterally. Normal patellar reflexes bilaterally. No foot drop bilaterally.     Assessment & Plan:  For your history of chronic back pain over the years with recent flare, I am going to prescribe diclofenac NSAID and Flexeril muscle relaxant.  While using diclofenac stop any over-the-counter NSAIDs as we discussed.  Also going to give you note to return to work on Saturday.  Rest over the next 2 days.  I do want you to go ahead and get x-ray of lumbar spine today since [place] and we might have some difficulty getting those old records.   If you have worsening or changing symptoms let us know.  Red flag signs and symptoms that would indicate ED evaluation reviewed with patient.   Can progress to stretching exercises as tolerated once pain level decreases.  Follow-up in 7-10 days or as needed.  Esperanza Richters, PA-C

## 2017-12-20 NOTE — Telephone Encounter (Signed)
Spoke   With  Pharmacist   At [facility]  [id number]   She  Has  Spoken  With  The  Patient   Earlier today  The  Flexeril   Had  [facility] a  [facility] [place]  And  Pharmacist   Has  Transferred it to  Her  [facility]armacy [facility]  [address]    And  She  Is  Filling  That   The  CitigroupPharmacist  Said  She  Could not locate  The Rx   For  Voltaren   Spoke  To  Marthenia RollingKim  [place]  [facility]  Who  Will review     RX written today    12/20/2016

## 2017-12-20 NOTE — Telephone Encounter (Signed)
Copied from CRM [phone]. Topic: Quick Communication - Rx Refill/Question >> Dec 20, 2017  1:37 PM Louie BunPalacios Medina, Rosey Batheresa D wrote: Medication: cyclobenzaprine (FLEXERIL) 10 MG tablet   diclofenac (VOLTAREN) 75 MG EC tablet  Has the patient contacted their pharmacy? Yes but medication has not been received by pharmacy.   (Agent: If no, request that the patient contact the pharmacy for the refill.)   Preferred Pharmacy (with phone number or street name): [id number] [place] [place] - [address]   Agent: Please be advised that RX refills may take up to 3 business days. We ask that you follow-up with your pharmacy.

## 2017-12-20 NOTE — Telephone Encounter (Signed)
Called to cancel Voltaren Rx that was [place] to [place] location however pharmacist states they have never filled fro Pt before. [facility] [address] [facility] Pt's request. [place] location states they cancel Rx in the event it comes in later.

## 2017-12-20 NOTE — Patient Instructions (Signed)
For your history of chronic back pain over the years with recent flare, I am going to prescribe diclofenac NSAID and Flexeril muscle relaxant.  While using diclofenac stop any over-the-counter NSAIDs as we discussed.  Also going to give you note to return to work on Saturday.  Rest over the next 2 days.  I do want you to go ahead and get x-ray of lumbar spine today since [place] and we might have some difficulty getting those old records.   If you have worsening or changing symptoms let us know.  Red flag signs and symptoms that would indicate ED evaluation reviewed with patient.   Can progress to stretching exercises as tolerated once pain level decreases.  Follow-up in 7-10 days or as needed.   Back Exercises If you have pain in your back, do these exercises 2-3 times each day or as told by your doctor. When the pain goes away, do the exercises once each day, but repeat the steps more times for each exercise (do more repetitions). If you do not have pain in your back, do these exercises once each day or as told by your doctor. Exercises Single Knee to Chest  Do these steps 3-5 times in a row for each leg: 1. Lie on your back on a firm bed or the floor with your legs stretched out. 2. Bring one knee to your chest. 3. Hold your knee to your chest by grabbing your knee or thigh. 4. Pull on your knee until you feel a gentle stretch in your lower back. 5. Keep doing the stretch for 10-30 seconds. 6. Slowly let go of your leg and straighten it.  Pelvic Tilt  Do these steps 5-10 times in a row: 1. Lie on your back on a firm bed or the floor with your legs stretched out. 2. Bend your knees so they point up to the ceiling. Your feet should be flat on the floor. 3. Tighten your lower belly (abdomen) muscles to press your lower back against the floor. This will make your tailbone point up to the ceiling instead of pointing down to your feet or the  floor. 4. Stay in this position for 5-10 seconds while you gently tighten your muscles and breathe evenly.  Cat-Cow  Do these steps until your lower back bends more easily: 1. Get on your hands and knees on a firm surface. Keep your hands under your shoulders, and keep your knees under your hips. You may put padding under your knees. 2. Let your head hang down, and make your tailbone point down to the floor so your lower back is round like the back of a cat. 3. Stay in this position for 5 seconds. 4. Slowly lift your head and make your tailbone point up to the ceiling so your back hangs low (sags) like the back of a cow. 5. Stay in this position for 5 seconds.  Press-Ups  Do these steps 5-10 times in a row: 1. Lie on your belly (face-down) on the floor. 2. Place your hands near your head, about shoulder-width apart. 3. While you keep your back relaxed and keep your hips on the floor, slowly straighten your arms to raise the top half of your body and lift your shoulders. Do not use your back muscles. To make yourself more comfortable, you may change where you place your hands. 4. Stay in this position for 5 seconds. 5. Slowly return to lying flat on  the floor.  Bridges  Do these steps 10 times in a row: 1. Lie on your back on a firm surface. 2. Bend your knees so they point up to the ceiling. Your feet should be flat on the floor. 3. Tighten your butt muscles and lift your butt off of the floor until your waist is almost as high as your knees. If you do not feel the muscles working in your butt and the back of your thighs, slide your feet 1-2 inches farther away from your butt. 4. Stay in this position for 3-5 seconds. 5. Slowly lower your butt to the floor, and let your butt muscles relax.  If this exercise is too easy, try doing it with your arms crossed over your chest. Belly Crunches  Do these steps 5-10 times in a row: 1. Lie on your back on a firm bed or the floor with your  legs stretched out. 2. Bend your knees so they point up to the ceiling. Your feet should be flat on the floor. 3. Cross your arms over your chest. 4. Tip your chin a little bit toward your chest but do not bend your neck. 5. Tighten your belly muscles and slowly raise your chest just enough to lift your shoulder blades a tiny bit off of the floor. 6. Slowly lower your chest and your head to the floor.  Back Lifts Do these steps 5-10 times in a row: 1. Lie on your belly (face-down) with your arms at your sides, and rest your forehead on the floor. 2. Tighten the muscles in your legs and your butt. 3. Slowly lift your chest off of the floor while you keep your hips on the floor. Keep the back of your head in line with the curve in your back. Look at the floor while you do this. 4. Stay in this position for 3-5 seconds. 5. Slowly lower your chest and your face to the floor.  Contact a doctor if:  Your back pain gets a lot worse when you do an exercise.  Your back pain does not lessen 2 hours after you exercise. If you have any of these problems, stop doing the exercises. Do not do them again unless your doctor says it is okay. Get help right away if:  You have sudden, very bad back pain. If this happens, stop doing the exercises. Do not do them again unless your doctor says it is okay. This information is not intended to replace advice given to you by your health care provider. Make sure you discuss any questions you have with your health care provider. Document Released: 12/02/2010 Document Revised: 04/06/2016 Document Reviewed: 12/24/2014 Elsevier Interactive Patient Education  [employer].

## 2017-12-21 ENCOUNTER — Telehealth: Payer: Self-pay | Admitting: Medical

## 2017-12-21 ENCOUNTER — Other Ambulatory Visit: Payer: Self-pay

## 2017-12-21 MED ORDER — DICLOFENAC SODIUM 75 MG PO TBEC: 75.0000 mg | DELAYED_RELEASE_TABLET | Freq: Two times a day (BID) | ORAL | 0 refills | Status: DC

## 2017-12-21 NOTE — Telephone Encounter (Signed)
[facility]   [address]  [facility] [address]    Spoke  With  Pharmacist  They  Have  pts  diclofinac rx    Filled   They  States  They  Have  Notified  The  Pt  About  15  mins  Ago    Attempted  To  Inform pt  As   Well   But  Having  Problems  Getting his  Voicemail

## 2017-12-21 NOTE — Telephone Encounter (Signed)
Copied from CRM [phone]. Topic: Inquiry >> Dec 21, 2017  8:26 AM Yvonna Alanisobinson, Andra M wrote: Reason for CRM: Patient was seen on yesterday here in the office. Patient stated that the pain medication Diclofenac (VOLTAREN) 75 MG EC tablet he was prescribed is not at his pharmacy. Patient needs this medication sent to his pharmacy this morning. [facility] is [id number] [place], [place] - [address] [phone] (Phone) [phone] (Fax). Someone from the office please contact this patient this morning.       Thank You!!!

## 2017-12-21 NOTE — Telephone Encounter (Signed)
Resent to local pharm/took out pharm in [place]/thx dmf

## 2017-12-21 NOTE — Telephone Encounter (Signed)
Spoke with Pt after CRM stating the pharmacy did not have his Rx for Voltaren. I spoke with Pt this morning and asked when he last checked and he stated it was earlier yesterday evening. Advised Pt to call Pharmacy and check again. I sent Rx in late yesterday evening. Pt stated he would check again. In the event it's not there I will call Rx in.

## 2018-03-12 ENCOUNTER — Ambulatory Visit: Payer: Self-pay | Admitting: *Deleted

## 2018-03-12 ENCOUNTER — Other Ambulatory Visit: Payer: Self-pay

## 2018-03-12 ENCOUNTER — Emergency Department (HOSPITAL_BASED_OUTPATIENT_CLINIC_OR_DEPARTMENT_OTHER)
Admission: EM | Admit: 2018-03-12 | Discharge: 2018-03-12 | Disposition: A | Discharge status: Home / Self Care | Payer: Managed Care, Other (non HMO) | Attending: Emergency Medicine | Admitting: Emergency Medicine

## 2018-03-12 ENCOUNTER — Emergency Department (HOSPITAL_BASED_OUTPATIENT_CLINIC_OR_DEPARTMENT_OTHER): Payer: Managed Care, Other (non HMO)

## 2018-03-12 ENCOUNTER — Encounter (HOSPITAL_BASED_OUTPATIENT_CLINIC_OR_DEPARTMENT_OTHER): Payer: Self-pay | Admitting: Emergency Medicine

## 2018-03-12 DIAGNOSIS — Z79899 Other long term (current) drug therapy: Secondary | ICD-10-CM | POA: Insufficient documentation

## 2018-03-12 DIAGNOSIS — R0789 Other chest pain: Secondary | ICD-10-CM | POA: Insufficient documentation

## 2018-03-12 DIAGNOSIS — R072 Precordial pain: Secondary | ICD-10-CM | POA: Diagnosis not present

## 2018-03-12 DIAGNOSIS — I10 Essential (primary) hypertension: Secondary | ICD-10-CM | POA: Diagnosis not present

## 2018-03-12 DIAGNOSIS — R079 Chest pain, unspecified: Secondary | ICD-10-CM | POA: Diagnosis present

## 2018-03-12 LAB — COMPREHENSIVE METABOLIC PANEL
ALBUMIN: 4.2 g/dL (ref 3.5–5.0)
ALK PHOS: 56 U/L (ref 38–126)
ALT: 20 U/L (ref 17–63)
AST: 24 U/L (ref 15–41)
Anion gap: 6 (ref 5–15)
BILIRUBIN TOTAL: 1.1 mg/dL (ref 0.3–1.2)
BUN: 20 mg/dL (ref 6–20)
CO2: 25 mmol/L (ref 22–32)
Calcium: 9.1 mg/dL (ref 8.9–10.3)
Chloride: 105 mmol/L (ref 101–111)
Creatinine, Ser: 0.98 mg/dL (ref 0.61–1.24)
GFR calc Af Amer: >60 mL/min (ref >60)
GFR calc non Af Amer: >60 mL/min (ref >60)
GLUCOSE: 103 mg/dL — AB (ref 65–99)
POTASSIUM: 4.1 mmol/L (ref 3.5–5.1)
Sodium: 136 mmol/L (ref 135–145)
TOTAL PROTEIN: 7.2 g/dL (ref 6.5–8.1)

## 2018-03-12 LAB — CBC
HEMATOCRIT: 43.3 % (ref 39.0–52.0)
HEMOGLOBIN: 15.3 g/dL (ref 13.0–17.0)
MCH: 31.5 pg (ref 26.0–34.0)
MCHC: 35.3 g/dL (ref 30.0–36.0)
MCV: 89.1 fL (ref 78.0–100.0)
Platelets: 177 10*3/uL (ref 150–400)
RBC: 4.86 MIL/uL (ref 4.22–5.81)
RDW: 14.1 % (ref 11.5–15.5)
WBC: 4.3 10*3/uL (ref 4.0–10.5)

## 2018-03-12 LAB — TROPONIN I: Troponin I: <0.03 ng/mL (ref <0.03)

## 2018-03-12 LAB — LIPASE, BLOOD: Lipase: 42 U/L (ref 11–51)

## 2018-03-12 NOTE — Telephone Encounter (Signed)
Called in c/o having chest pain in the middle of his chest for 3-4 days that is more painful when he presses in on the center of his chest.   He denies any other s/s.  When I instructed him to go to the ED he mentioned,  "I'm in the parking lot at the office now".   I instructed him to go on into the ED NOT the office which is upstairs in the same building to be evaluated.   I let him know I would let Dr. Carmelia Roller know you were in the ED and for what reason.    He agreed to this plan and is going into the [facility] ED now.  I routed a note to Dr. Carmelia Roller.    Reason for Disposition . [1] Chest pain lasts > 5 minutes AND [2] age > 60  Answer Assessment - Initial Assessment Questions 1. LOCATION: "Where does it hurt?"       Chest pain in the center of the chest. 2. RADIATION: "Does the pain go anywhere else?" (e.g., into neck, jaw, arms, back)     No radiation 3. ONSET: "When did the chest pain begin?" (Minutes, hours or days)      3-4 days ago. 4. PATTERN "Does the pain come and go, or has it been constant since it started?"  "Does it get worse with exertion?"      Constant pain.   When I press on the center of my chest I feel it hurts more.  I work in a [employer].   I've not done anything different.    5. DURATION: "How long does it last" (e.g., seconds, minutes, hours)     Constant 6. SEVERITY: "How bad is the pain?"  (e.g., Scale 1-10; mild, moderate, or severe)    - MILD (1-3): doesn't interfere with normal activities     - MODERATE (4-7): interferes with normal activities or awakens from sleep    - SEVERE (8-10): excruciating pain, unable to do any normal activities       7 on pain scale.     I took Ibuprofen.   It helped a little bit. 7. CARDIAC RISK FACTORS: "Do you have any history of heart problems or risk factors for heart disease?" (e.g., prior heart attack, angina; high blood pressure, diabetes, being overweight, high cholesterol, smoking, or strong family history of  heart disease)     Yes hypertension with medication. Elevated cholesterol with medication.  Not diabetic.  Not over weight.   Do not smoke. 8. PULMONARY RISK FACTORS: "Do you have any history of lung disease?"  (e.g., blood clots in lung, asthma, emphysema, birth control pills)     No blood clots or other breathing problems 9. CAUSE: "What do you think is causing the chest pain?"     I don't know. 10. OTHER SYMPTOMS: "Do you have any other symptoms?" (e.g., dizziness, nausea, vomiting, sweating, fever, difficulty breathing, cough)       No shortness of breath, sweating, dizziness or nausea. 11. PREGNANCY: "Is there any chance you are pregnant?" "When was your last menstrual period?"       N/A  Protocols used: CHEST PAIN-A-AH

## 2018-03-12 NOTE — Telephone Encounter (Signed)
FYI to PCP

## 2018-03-12 NOTE — ED Notes (Signed)
ED Provider at bedside discussing test results and dispo plan of care. 

## 2018-03-12 NOTE — ED Triage Notes (Signed)
Soreness to upper sternum that is tender to touch. No known injury.  No hx of this sort of pain.  Does not radiate.  Denies SOB, weakness, n/v.

## 2018-03-13 NOTE — ED Provider Notes (Signed)
[facility] EMERGENCY DEPARTMENT Provider Note   CSN: [id number] Arrival date & time: 03/12/18  4098     History   Chief Complaint Chief Complaint  Patient presents with  . Chest Pain    HPI Joel Young is a 62 y.o. male.  HPI 62 year old male presents emergency department complaints of increasing soreness and tenderness to his anterior chest and left parasternal region.  He does not remember any injury.  No shortness of breath.  No radiation of his pain.  He states his pain is worse with palpation of his left costosternal junction.  No rash.  Denies nausea vomiting.  No weakness.  Denies back pain.  No neck or jaw pain.  Symptoms are mild in severity.  He tried over-the-counter medications without improvement in his pain.   Past Medical History:  Diagnosis Date  . Elevated PSA   . GERD (gastroesophageal reflux disease)   . High cholesterol   . Hypertension     There are no active problems to display for this patient.   Past Surgical History:  Procedure Laterality Date  . APPENDECTOMY    . BACK SURGERY     two back surgeries to help with sciatica        Home Medications    Prior to Admission medications   Medication Sig Start Date End Date Taking? Authorizing Provider  cholecalciferol (VITAMIN D) 1000 units tablet Take 1,000 Units by mouth daily.    [provider]  cyclobenzaprine (FLEXERIL) 10 MG tablet Take 1 tablet (10 mg total) by mouth at bedtime. 12/20/17   Saguier, Ramon Dredge, PA-C  diclofenac (VOLTAREN) 75 MG EC tablet Take 1 tablet (75 mg total) by mouth 2 (two) times daily. 12/21/17   Sharlene Dory, DO  finasteride (PROSCAR) 5 MG tablet  12/02/17   [provider]  metoprolol succinate (TOPROL-XL) 50 MG 24 hr tablet Take 50 mg by mouth daily.     [provider]  potassium chloride (K-DUR,KLOR-CON) 10 MEQ tablet Take 10 mEq by mouth every other day.     [provider]  rosuvastatin (CRESTOR) 5 MG tablet   11/12/17   [provider]  triamterene-hydrochlorothiazide (MAXZIDE-25) 37.5-25 MG per tablet Take 0.5 tablets by mouth every other day.     [provider]    Family History Family History  Problem Relation Age of Onset  . Prostate cancer Father 38    Social History Social History   Tobacco Use  . Smoking status: Never Smoker  . Smokeless tobacco: Never Used  Substance Use Topics  . Alcohol use: No  . Drug use: No     Allergies   Caffeine; Ciprofloxacin; Famotidine; and Keflex [cephalexin]   Review of Systems Review of Systems  All other systems reviewed and are negative.    Physical Exam Updated Vital Signs BP 137/89 (BP Location: Right Arm)   Pulse 61   Temp 99 F (37.2 C) (Oral)   Resp 17   Ht  (1.753 m)   Wt 79.4 kg (175 lb)   SpO2 100%   BMI 25.84 kg/m   Physical Exam  Constitutional: He is oriented to person, place, and time. He appears well-developed and well-nourished.  HENT:  Head: Normocephalic and atraumatic.  Eyes: EOM are normal.  Neck: Normal range of motion.  Cardiovascular: Normal rate, regular rhythm, normal heart sounds and intact distal pulses.  Pulmonary/Chest: Effort normal and breath sounds normal. No respiratory distress.  Tenderness over the sternum and  the left costosternal junction.  No rash noted in this area  Abdominal: Soft. He exhibits no distension. There is no tenderness.  Musculoskeletal: Normal range of motion.  Neurological: He is alert and oriented to person, place, and time.  Skin: Skin is warm and dry.  Psychiatric: He has a normal mood and affect. Judgment normal.  Nursing note and vitals reviewed.    ED Treatments / Results  Labs (all labs ordered are listed, but only abnormal results are displayed) Labs Reviewed  COMPREHENSIVE METABOLIC PANEL - Abnormal; Notable for the following components:      Result Value   Glucose, Bld 103 (*)    All other components within normal limits  CBC   LIPASE, BLOOD  TROPONIN I    EKG EKG Interpretation  Date/Time:  Tuesday March 12 2018 08:44:59 EDT Ventricular Rate:  65 PR Interval:    QRS Duration: 97 QT Interval:  386 QTC Calculation: 402 R Axis:   10 Text Interpretation:  Sinus rhythm Anteroseptal infarct, old No significant change was found Confirmed by Azalia Bilis ([id number]) on 03/12/2018 8:51:48 AM   Radiology Dg Chest 2 View  Result Date: 03/12/2018 CLINICAL DATA:  Chest pain EXAM: CHEST - 2 VIEW COMPARISON:  June 08, 2016 FINDINGS: There is no edema or consolidation. The heart size and pulmonary vascularity are normal. No adenopathy. There is degenerative change in the thoracic spine. IMPRESSION: No edema or consolidation. Electronically Signed   By: Bretta Bang III M.D.   On: 03/12/2018 09:46    Procedures Procedures (including critical care time)  Medications Ordered in ED Medications - No data to display   Initial Impression / Assessment and Plan / ED Course  I have reviewed the triage vital signs and the nursing notes.  Pertinent labs & imaging results that were available during my care of the patient were reviewed by me and considered in my medical decision making (see chart for details).     Pain seems reproducible and seems to be chest wall related pain.  Work-up in the emergency department including chest x-ray EKG labs and troponin are without significant abnormality.  Chest x-ray personally reviewed and demonstrates no cardiopulmonary radiographic abnormalities  Primary care follow-up  Patient understands return to the emergency department for new or worsening symptoms  Final Clinical Impressions(s) / ED Diagnoses   Final diagnoses:  Chest wall pain  Precordial pain    ED Discharge Orders    None       Azalia Bilis, MD 03/13/18 [phone]

## 2018-04-26 IMAGING — DX DG LUMBAR SPINE 2-3V
3 series · 3 of 3 positions shown · non-contrast
Comparison: None.

CLINICAL DATA: Low back pain

EXAM:
LUMBAR SPINE - 3 VIEW

[l-spine ap]
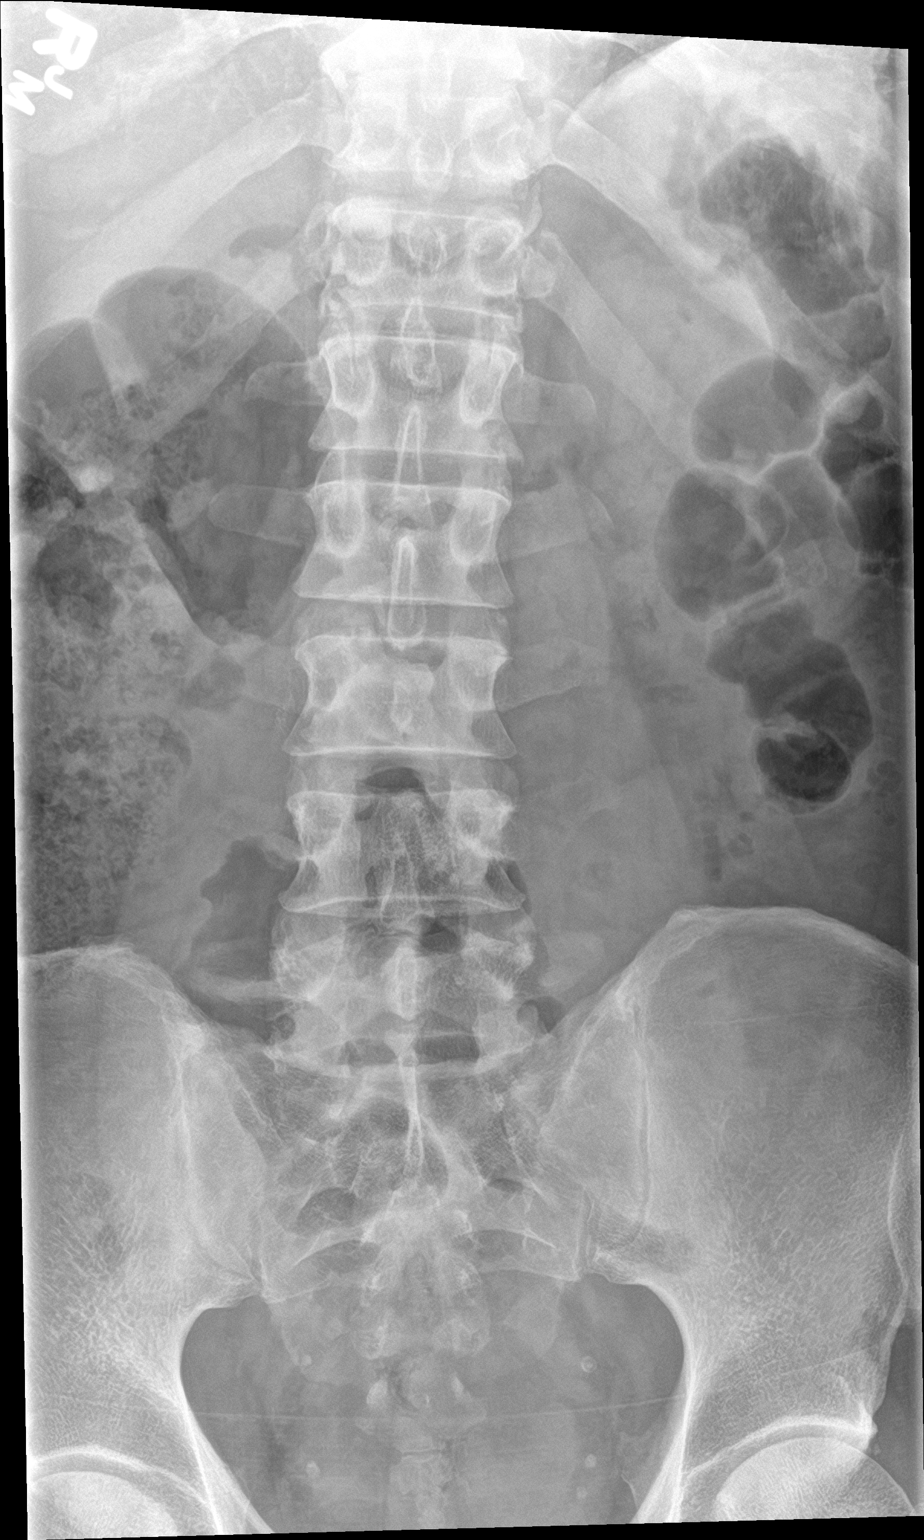

[l-spine lat]
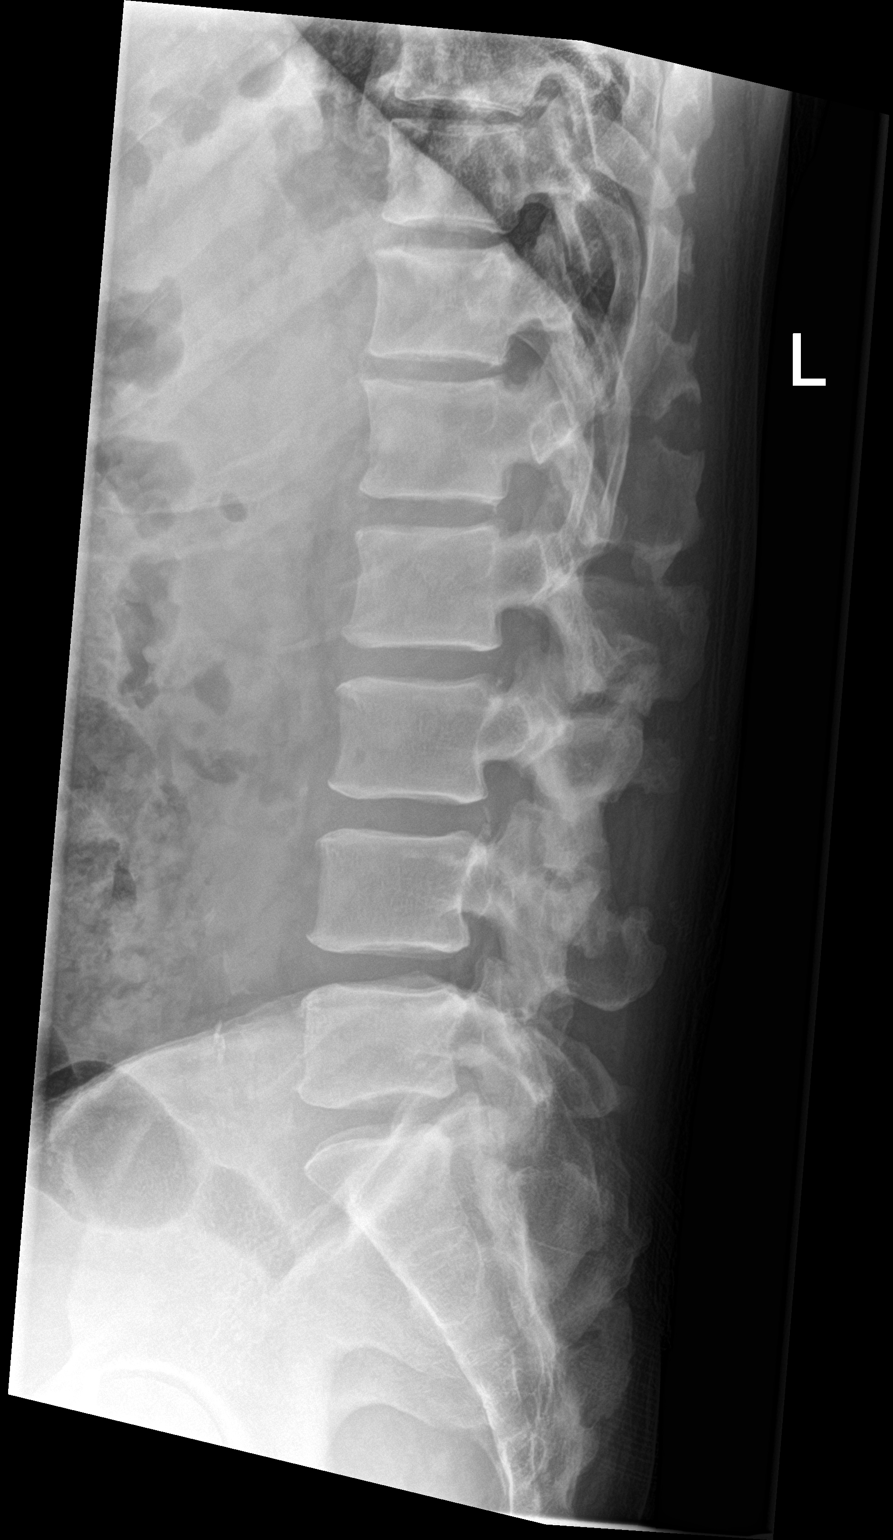

[l-spine spot]
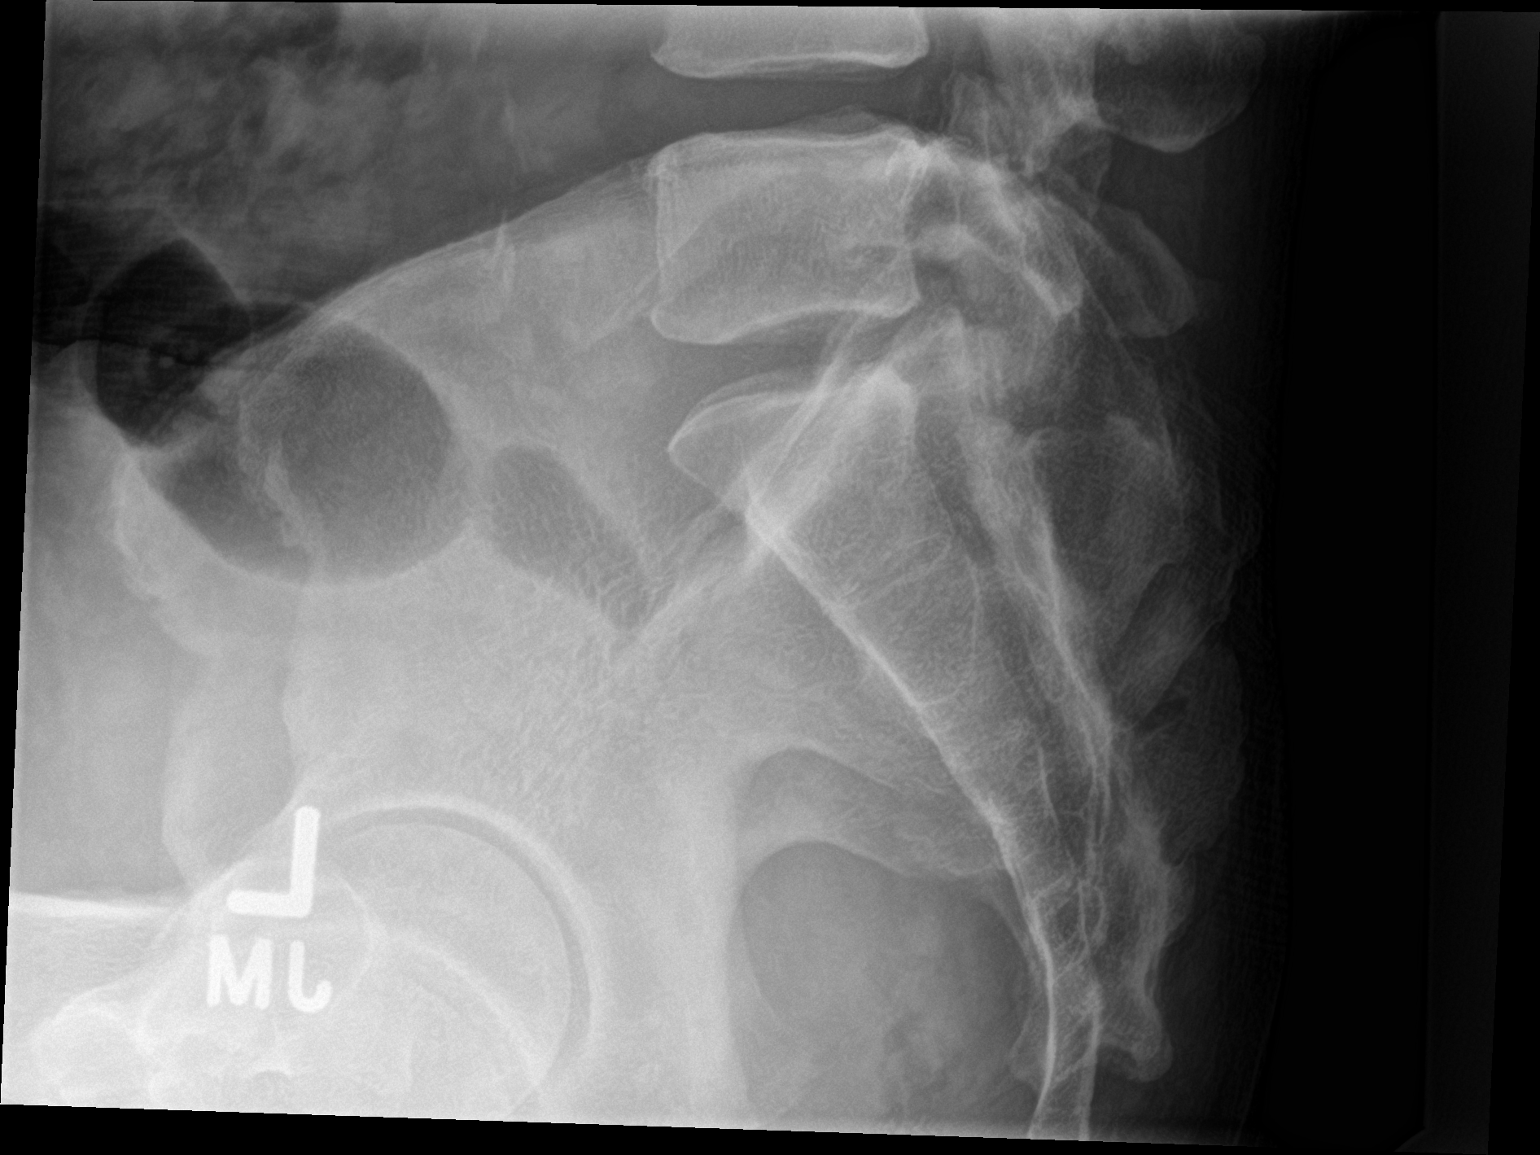

[3 of 3 positions shown; findings below may reference images not displayed]

FINDINGS: Five lumbar type vertebral bodies are well visualized. Vertebral
body height is well maintained. Loss of the normal lumbar lordosis
is noted which may be related to muscular spasm. Mild facet
hypertrophic changes are seen. No acute soft tissue abnormality is
noted.
IMPRESSION: Mild degenerative change without acute abnormality.

## 2018-11-26 ENCOUNTER — Ambulatory Visit (INDEPENDENT_AMBULATORY_CARE_PROVIDER_SITE_OTHER): Payer: Managed Care, Other (non HMO) | Admitting: Family

## 2018-11-26 ENCOUNTER — Encounter: Payer: Self-pay | Admitting: Family

## 2018-11-26 VITALS — BP 119/73 | HR 58 | Temp 98.6°F | Resp 18 | Ht 70.0 in | Wt 174.0 lb

## 2018-11-26 DIAGNOSIS — R531 Weakness: Secondary | ICD-10-CM | POA: Diagnosis not present

## 2018-11-26 DIAGNOSIS — R251 Tremor, unspecified: Secondary | ICD-10-CM | POA: Diagnosis not present

## 2018-11-26 LAB — COMPREHENSIVE METABOLIC PANEL
ALK PHOS: 57 U/L (ref 39–117)
ALT: 28 U/L (ref 0–53)
AST: 20 U/L (ref 0–37)
Albumin: 4.6 g/dL (ref 3.5–5.2)
BUN: 14 mg/dL (ref 6–23)
CO2: 30 mEq/L (ref 19–32)
Calcium: 9.9 mg/dL (ref 8.4–10.5)
Chloride: 103 mEq/L (ref 96–112)
Creatinine, Ser: 1.12 mg/dL (ref 0.40–1.50)
GFR: 85.52 mL/min (ref >60.00)
Glucose, Bld: 84 mg/dL (ref 70–99)
Potassium: 4.4 mEq/L (ref 3.5–5.1)
Sodium: 138 mEq/L (ref 135–145)
TOTAL PROTEIN: 7.4 g/dL (ref 6.0–8.3)
Total Bilirubin: 1.6 mg/dL — ABNORMAL HIGH (ref 0.2–1.2)

## 2018-11-26 LAB — CBC WITH DIFFERENTIAL/PLATELET
BASOS PCT: 0.4 % (ref 0.0–3.0)
Basophils Absolute: 0 10*3/uL (ref 0.0–0.1)
EOS ABS: 0.2 10*3/uL (ref 0.0–0.7)
EOS PCT: 3.5 % (ref 0.0–5.0)
HCT: 47.4 % (ref 39.0–52.0)
HEMOGLOBIN: 15.8 g/dL (ref 13.0–17.0)
LYMPHS PCT: 49.3 % — AB (ref 12.0–46.0)
Lymphs Abs: 2.5 10*3/uL (ref 0.7–4.0)
MCHC: 33.3 g/dL (ref 30.0–36.0)
MCV: 93.8 fl (ref 78.0–100.0)
MONOS PCT: 8.1 % (ref 3.0–12.0)
Monocytes Absolute: 0.4 10*3/uL (ref 0.1–1.0)
NEUTROS ABS: 2 10*3/uL (ref 1.4–7.7)
Neutrophils Relative %: 38.7 % — ABNORMAL LOW (ref 43.0–77.0)
Platelets: 162 10*3/uL (ref 150.0–400.0)
RBC: 5.06 Mil/uL (ref 4.22–5.81)
RDW: 13.9 % (ref 11.5–15.5)
WBC: 5.1 10*3/uL (ref 4.0–10.5)

## 2018-11-26 LAB — CK: Total CK: 184 U/L (ref 7–232)

## 2018-11-26 LAB — TSH: TSH: 1.49 u[IU]/mL (ref 0.35–4.50)

## 2018-11-26 NOTE — Patient Instructions (Signed)
Please complete lab work prior to leaving.  Call if new/worsening symptoms.  

## 2018-11-26 NOTE — Progress Notes (Signed)
Subjective:    Patient ID: Joel Young, male    DOB: [birth year], 63 y.o.   MRN: [id number]  HPI  Patient is a 63 yr old male who presents today with chief complaint of tremor.  Denies joint pain to me (though this is what he told CMA).  Feels like a vibration/shanking inside accompanied by a generalized weakness.  Often will notice this when he tries to write. There is no loss of consciousness when this occurs.  Usually he reports that he can't see the tremor, but sometimes if it occurs in his legs he can see some trembling with standing. Reports that he does not have any palpitations.  Denies fevers, nausea/vomitting.   Finds himself needing to sit down when this occurs and notes that after a while it seems to ease off.  He denies associated CP, SOB or paresthesias.  Reports that this is present first thing in the AM and then occurs again in the evening. He reports that he has been on crestor for a long time. Reports hx of high CK level.  Note of last CK level in 2018 was WNL.    Review of Systems See HPI  Past Medical History:  Diagnosis Date  . Elevated PSA   . GERD (gastroesophageal reflux disease)   . High cholesterol   . Hypertension      Social History   Socioeconomic History  . Marital status: Married    Spouse name: Not on file  . Number of children: Not on file  . Years of education: Not on file  . Highest education level: Not on file  Occupational History  . Not on file  Social Needs  . Financial resource strain: Not on file  . Food insecurity:    Worry: Not on file    Inability: Not on file  . Transportation needs:    Medical: Not on file    Non-medical: Not on file  Tobacco Use  . Smoking status: Never Smoker  . Smokeless tobacco: Never Used  Substance and Sexual Activity  . Alcohol use: No  . Drug use: No  . Sexual activity: Not on file  Lifestyle  . Physical activity:    Days per week: Not on file    Minutes per session: Not on file  . Stress: Not  on file  Relationships  . Social connections:    Talks on phone: Not on file    Gets together: Not on file    Attends religious service: Not on file    Active member of club or organization: Not on file    Attends meetings of clubs or organizations: Not on file    Relationship status: Not on file  . Intimate partner violence:    Fear of current or ex partner: Not on file    Emotionally abused: Not on file    Physically abused: Not on file    Forced sexual activity: Not on file  Other Topics Concern  . Not on file  Social History Narrative  . Not on file    Past Surgical History:  Procedure Laterality Date  . APPENDECTOMY    . BACK SURGERY     two back surgeries to help with sciatica    Family History  Problem Relation Age of Onset  . Prostate cancer Father 54    Allergies  Allergen Reactions  . Caffeine Other (See Comments)    tremors  . Ciprofloxacin   . Famotidine  Causes heart to race  . Keflex [Cephalexin] Hives    Current Outpatient Medications on File Prior to Visit  Medication Sig Dispense Refill  . cholecalciferol (VITAMIN D) 1000 units tablet Take 1,000 Units by mouth daily.    . cyclobenzaprine (FLEXERIL) 10 MG tablet Take 1 tablet (10 mg total) by mouth at bedtime. 20 tablet 0  . diclofenac (VOLTAREN) 75 MG EC tablet Take 1 tablet (75 mg total) by mouth 2 (two) times daily. 30 tablet 0  . finasteride (PROSCAR) 5 MG tablet     . metoprolol succinate (TOPROL-XL) 50 MG 24 hr tablet Take 50 mg by mouth daily.     . potassium chloride (K-DUR,KLOR-CON) 10 MEQ tablet Take 10 mEq by mouth every other day.     . rosuvastatin (CRESTOR) 5 MG tablet     . triamterene-hydrochlorothiazide (MAXZIDE-25) 37.5-25 MG per tablet Take 0.5 tablets by mouth every other day.      No current facility-administered medications on file prior to visit.     BP 119/73 (BP Location: Left Arm, Patient Position: Sitting, Cuff Size: Small)   Pulse (!) 58   Temp 98.6 F (37 C)  (Oral)   Resp 18   Ht 5\' 10"  (1.778 m)   Wt 174 lb (78.9 kg)   SpO2 100%   BMI 24.97 kg/m       Objective:   Physical Exam Constitutional:      General: He is not in acute distress.    Appearance: He is well-developed.  HENT:     Head: Normocephalic and atraumatic.  Eyes:     Extraocular Movements: Extraocular movements intact.  Cardiovascular:     Rate and Rhythm: Normal rate and regular rhythm.     Heart sounds: No murmur.  Pulmonary:     Effort: Pulmonary effort is normal. No respiratory distress.     Breath sounds: Normal breath sounds. No wheezing or rales.  Abdominal:     General: Bowel sounds are normal. There is no distension.     Palpations: Abdomen is soft.  Musculoskeletal:        General: No swelling.  Skin:    General: Skin is warm and dry.  Neurological:     Mental Status: He is alert and oriented to person, place, and time.     Deep Tendon Reflexes:     Reflex Scores:      Patellar reflexes are 2+ on the right side and 2+ on the left side.    Comments: Bilateral UE/LE stregth is 5/5 + facial symmetry,  No obvious tremor noted.   Psychiatric:        Behavior: Behavior normal.        Thought Content: Thought content normal.           Assessment & Plan:  Tremor- history is unclear. Pt is a poor historian.  Will obtain CMET, CBC, TSH, and CK level.  Anxiety is a possibility.  We discussed that he should follow up with pcp in 2 weeks and let us know sooner if symptoms worsen. If lab work unrevealing and symptoms persist further evaluation of anxiety and possible neurology consult could be considered.

## 2018-11-28 ENCOUNTER — Encounter: Payer: Self-pay | Admitting: Family

## 2018-12-18 ENCOUNTER — Ambulatory Visit: Payer: Managed Care, Other (non HMO) | Admitting: Family Medicine

## 2020-02-05 ENCOUNTER — Encounter (HOSPITAL_BASED_OUTPATIENT_CLINIC_OR_DEPARTMENT_OTHER): Payer: Self-pay | Admitting: Emergency Medicine

## 2020-02-05 ENCOUNTER — Emergency Department (HOSPITAL_BASED_OUTPATIENT_CLINIC_OR_DEPARTMENT_OTHER)
Admission: EM | Admit: 2020-02-05 | Discharge: 2020-02-05 | Disposition: A | Discharge status: Home / Self Care | Payer: Managed Care, Other (non HMO) | Attending: Emergency Medicine | Admitting: Emergency Medicine

## 2020-02-05 ENCOUNTER — Other Ambulatory Visit: Payer: Self-pay

## 2020-02-05 DIAGNOSIS — M542 Cervicalgia: Secondary | ICD-10-CM | POA: Insufficient documentation

## 2020-02-05 DIAGNOSIS — Y9289 Other specified places as the place of occurrence of the external cause: Secondary | ICD-10-CM | POA: Insufficient documentation

## 2020-02-05 DIAGNOSIS — I10 Essential (primary) hypertension: Secondary | ICD-10-CM | POA: Diagnosis not present

## 2020-02-05 DIAGNOSIS — Y999 Unspecified external cause status: Secondary | ICD-10-CM | POA: Insufficient documentation

## 2020-02-05 DIAGNOSIS — M7918 Myalgia, other site: Secondary | ICD-10-CM

## 2020-02-05 DIAGNOSIS — Z79899 Other long term (current) drug therapy: Secondary | ICD-10-CM | POA: Diagnosis not present

## 2020-02-05 DIAGNOSIS — M545 Low back pain: Secondary | ICD-10-CM | POA: Insufficient documentation

## 2020-02-05 DIAGNOSIS — Y9389 Activity, other specified: Secondary | ICD-10-CM | POA: Insufficient documentation

## 2020-02-05 MED ORDER — CYCLOBENZAPRINE HCL 10 MG PO TABS: 10.0000 mg | ORAL_TABLET | Freq: Two times a day (BID) | ORAL | 0 refills | Status: AC | PRN

## 2020-02-05 MED ORDER — IBUPROFEN 800 MG PO TABS
800.0000 mg | ORAL_TABLET | Freq: Once | ORAL | Status: AC
  Administered 2020-02-05: 800 mg via ORAL
  Filled 2020-02-05: qty 1
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
## 2020-02-05 NOTE — Discharge Instructions (Signed)
Use 600 mg of Motrin 3 times a day as needed for pain, 1000 mg of Tylenol as needed for pain up to 4 tabs a day, take muscle relaxant Flexeril as needed do not mix Flexeril with alcohol or drugs
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
## 2020-06-01 ENCOUNTER — Other Ambulatory Visit: Payer: Self-pay

## 2020-06-01 ENCOUNTER — Encounter: Payer: Self-pay | Admitting: Medical

## 2020-06-01 ENCOUNTER — Ambulatory Visit (INDEPENDENT_AMBULATORY_CARE_PROVIDER_SITE_OTHER): Payer: Managed Care, Other (non HMO) | Admitting: Medical

## 2020-06-01 VITALS — BP 122/70 | HR 115 | Resp 18 | Ht 69.0 in | Wt 171.0 lb

## 2020-06-01 DIAGNOSIS — L819 Disorder of pigmentation, unspecified: Secondary | ICD-10-CM | POA: Diagnosis not present

## 2020-06-01 DIAGNOSIS — L723 Sebaceous cyst: Secondary | ICD-10-CM

## 2020-06-01 MED ORDER — DOXYCYCLINE HYCLATE 100 MG PO TABS: 100.0000 mg | ORAL_TABLET | Freq: Two times a day (BID) | ORAL | 0 refills | Status: DC

## 2020-06-01 NOTE — Progress Notes (Signed)
   Subjective:    Patient ID: Joel Young, male    DOB: 10-21-1956, 64 y.o.   MRN: [id number]  HPI  Past week pt notes area in for left upper back area of skin that he felt was little irritated and he saw hypopigmeted patch. He applied some alcohol wipes and  states some pain. No tick bite or mosquitoe bit.    Review of Systems See hpi.    Objective:   Physical Exam General- No acute distress. Pleasant patient. Neck- Full range of motion, no jvd Lungs- Clear, even and unlabored. Heart- regular rate and rhythm. Neurologic- CNII- XII grossly intact.  Back- lt side back lateral upper scapula 8 mm hypopigmented area but adjacent to this area on palpation feels like small sebacious cyst on palpation. No dc.       Assessment & Plan:  You do have area of your back which by palpation and inspection looks like inflammed and possible infected sebaceos cyst. For this will give doxycycline antibiotic. Rx advisment given.  For the hypopigmented area will refer to dermatolgogist. Will see over next 2-3 weeks if area repigments.  Follow up in 10-14 days.  Time spent with patient today was 25  minutes which consisted of chart review, discussing diagnosis,  Treatment, referral  and documentation.

## 2020-06-01 NOTE — Patient Instructions (Addendum)
You do have area of your back which by palpation and inspection looks like inflammed and possible infected sebaceos cyst. For this will give doxycycline antibiotic. Rx advisment given.  For the hypopigmented area will refer to dermatolgogist. Will see over next 2-3 weeks if area repigments.  Follow up in 10-14 days.

## 2021-05-01 ENCOUNTER — Encounter (HOSPITAL_BASED_OUTPATIENT_CLINIC_OR_DEPARTMENT_OTHER): Payer: Self-pay | Admitting: Emergency Medicine

## 2021-05-01 ENCOUNTER — Other Ambulatory Visit: Payer: Self-pay

## 2021-05-01 ENCOUNTER — Emergency Department (HOSPITAL_BASED_OUTPATIENT_CLINIC_OR_DEPARTMENT_OTHER)
Admission: EM | Admit: 2021-05-01 | Discharge: 2021-05-01 | Disposition: A | Discharge status: Home / Self Care | Payer: Managed Care, Other (non HMO) | Attending: Emergency Medicine | Admitting: Emergency Medicine

## 2021-05-01 ENCOUNTER — Emergency Department (HOSPITAL_BASED_OUTPATIENT_CLINIC_OR_DEPARTMENT_OTHER): Payer: Managed Care, Other (non HMO)

## 2021-05-01 DIAGNOSIS — Z79899 Other long term (current) drug therapy: Secondary | ICD-10-CM | POA: Insufficient documentation

## 2021-05-01 DIAGNOSIS — U071 COVID-19: Secondary | ICD-10-CM

## 2021-05-01 DIAGNOSIS — R519 Headache, unspecified: Secondary | ICD-10-CM | POA: Diagnosis present

## 2021-05-01 DIAGNOSIS — I10 Essential (primary) hypertension: Secondary | ICD-10-CM | POA: Insufficient documentation

## 2021-05-01 LAB — CBC
HCT: 47.6 % (ref 39.0–52.0)
Hemoglobin: 16.2 g/dL (ref 13.0–17.0)
MCH: 31.2 pg (ref 26.0–34.0)
MCHC: 34 g/dL (ref 30.0–36.0)
MCV: 91.7 fL (ref 80.0–100.0)
Platelets: 166 10*3/uL (ref 150–400)
RBC: 5.19 MIL/uL (ref 4.22–5.81)
RDW: 13.8 % (ref 11.5–15.5)
WBC: 5.7 10*3/uL (ref 4.0–10.5)
nRBC: 0 % (ref 0.0–0.2)

## 2021-05-01 LAB — COMPREHENSIVE METABOLIC PANEL
ALT: 24 U/L (ref 0–44)
AST: 24 U/L (ref 15–41)
Albumin: 4.4 g/dL (ref 3.5–5.0)
Alkaline Phosphatase: 48 U/L (ref 38–126)
Anion gap: 8 (ref 5–15)
BUN: 17 mg/dL (ref 8–23)
CO2: 26 mmol/L (ref 22–32)
Calcium: 9.4 mg/dL (ref 8.9–10.3)
Chloride: 101 mmol/L (ref 98–111)
Creatinine, Ser: 1.02 mg/dL (ref 0.61–1.24)
GFR, Estimated: >60 mL/min (ref >60)
Glucose, Bld: 142 mg/dL — ABNORMAL HIGH (ref 70–99)
Potassium: 3.6 mmol/L (ref 3.5–5.1)
Sodium: 135 mmol/L (ref 135–145)
Total Bilirubin: 1.4 mg/dL — ABNORMAL HIGH (ref 0.3–1.2)
Total Protein: 8.1 g/dL (ref 6.5–8.1)

## 2021-05-01 LAB — URINALYSIS, ROUTINE W REFLEX MICROSCOPIC
Bilirubin Urine: NEGATIVE
Glucose, UA: NEGATIVE mg/dL
Hgb urine dipstick: NEGATIVE
Ketones, ur: NEGATIVE mg/dL
Leukocytes,Ua: NEGATIVE
Nitrite: NEGATIVE
Protein, ur: NEGATIVE mg/dL
Specific Gravity, Urine: 1.02 (ref 1.005–1.030)
pH: 7 (ref 5.0–8.0)

## 2021-05-01 LAB — LIPASE, BLOOD: Lipase: 38 U/L (ref 11–51)

## 2021-05-01 MED ORDER — ONDANSETRON 4 MG PO TBDP: 4.0000 mg | ORAL_TABLET | Freq: Three times a day (TID) | ORAL | 0 refills | Status: DC | PRN

## 2021-05-01 MED ORDER — ACETAMINOPHEN 325 MG PO TABS
650.0000 mg | ORAL_TABLET | Freq: Once | ORAL | Status: AC
  Administered 2021-05-01: 650 mg via ORAL
  Filled 2021-05-01: qty 2

## 2021-05-01 MED ORDER — ONDANSETRON 4 MG PO TBDP
4.0000 mg | ORAL_TABLET | Freq: Once | ORAL | Status: AC
  Administered 2021-05-01: 4 mg via ORAL
  Filled 2021-05-01: qty 1

## 2021-05-01 NOTE — Discharge Instructions (Addendum)
  You were evaluated in the Emergency Department and after careful evaluation, we did not find any emergent condition requiring admission or further testing in the hospital.   Your exam/testing today was overall reassuring.  Symptoms seem to be due to COVID-19 infection.  Please use the attached instructions.  I also did prescribe you some Zofran which you can take when you feel nauseous, please make sure to stay hydrated and continue to eat.  I also provided you with a work note.  As we discussed I want you to have a repeat chest x-ray in the next 3 to 4 weeks, you can schedule this with your primary care doctor.  Please return to the Emergency Department if you experience any worsening of your condition.  Thank you for allowing [place] to be a part of your care. Please speak to your pharmacist about any new medications prescribed today in regards to side effects or interactions with other medications.
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
## 2021-05-01 NOTE — ED Provider Notes (Signed)
[facility] EMERGENCY DEPARTMENT Provider Note   CSN: [id number] Arrival date & time: 05/01/21  1246     History Chief Complaint  Patient presents with   Headache   Abdominal Pain    Joel Young is a 65 y.o. male with past medical history of hypertension and hypercholesterol that presents to the emergency department today for COVID symptoms.  Patient states that he had a home test on Monday, 6 days ago and tested positive for COVID.  Patient states that his symptoms started that morning.  Symptoms include headache, cough, generalized body aches, nausea and chills.  States that the symptoms worsened over the past 3 days therefore he came here to get evaluated.  Denies any chest pain or shortness of breath.  Patient states that he was vaccinated against COVID, does admit to a sick contact at work.  Patient states that he supposed to go back to work on Monday, however still feels ill therefore coming to get assessed in the ER today.  States that he has taken any medications for his symptoms.  States that his headache and myalgias are his primary symptoms, headache is intermittent, comes and goes is before currently all over his head.  Denies any vision changes, neck pain or fevers.  Patient also admits to myalgias primarily in his lower extremities.  States that cough is intermittent, worse at night, is getting better.  Denies any vomiting or diarrhea, however states that the only thing that he feels like eating is Ensure.  States that he is generally healthy, lives at home alone.  No other complaints at this time.  HPI     Past Medical History:  Diagnosis Date   Elevated PSA    GERD (gastroesophageal reflux disease)    High cholesterol    Hypertension     There are no problems to display for this patient.   Past Surgical History:  Procedure Laterality Date   APPENDECTOMY     BACK SURGERY     two back surgeries to help with sciatica       Family History  Problem  Relation Age of Onset   Prostate cancer Father 90    Social History   Tobacco Use   Smoking status: Never   Smokeless tobacco: Never  Substance Use Topics   Alcohol use: Not Currently   Drug use: No    Home Medications Prior to Admission medications   Medication Sig Start Date End Date Taking? Authorizing Provider  ondansetron (ZOFRAN ODT) 4 MG disintegrating tablet Take 1 tablet (4 mg total) by mouth every 8 (eight) hours as needed for nausea or vomiting. 05/01/21  Yes Farrel Gordon, PA-C  cholecalciferol (VITAMIN D) 1000 units tablet Take 1,000 Units by mouth daily.    [provider]  diclofenac (VOLTAREN) 75 MG EC tablet Take 1 tablet (75 mg total) by mouth 2 (two) times daily. Patient not taking: Reported on 06/01/2020 12/21/17   Sharlene Dory, DO  doxycycline (VIBRA-TABS) 100 MG tablet Take 1 tablet (100 mg total) by mouth 2 (two) times daily. 06/01/20   Saguier, Ramon Dredge, PA-C  finasteride (PROSCAR) 5 MG tablet  12/02/17   [provider]  metoprolol succinate (TOPROL-XL) 50 MG 24 hr tablet Take 50 mg by mouth daily.     [provider]  potassium chloride (K-DUR,KLOR-CON) 10 MEQ tablet Take 10 mEq by mouth every other day.     [provider]  rosuvastatin (CRESTOR) 5 MG tablet  11/12/17  [provider]  triamterene-hydrochlorothiazide (MAXZIDE-25) 37.5-25 MG per tablet Take 0.5 tablets by mouth every other day.     [provider]    Allergies    Caffeine, Ciprofloxacin, Famotidine, and Keflex [cephalexin]  Review of Systems   Review of Systems  Constitutional:  Positive for chills. Negative for diaphoresis, fatigue and fever.  HENT:  Negative for congestion, sore throat and trouble swallowing.   Eyes:  Negative for pain and visual disturbance.  Respiratory:  Positive for cough. Negative for shortness of breath and wheezing.   Cardiovascular:  Negative for chest pain, palpitations and leg swelling.   Gastrointestinal:  Positive for nausea. Negative for abdominal distention, abdominal pain, diarrhea and vomiting.  Genitourinary:  Negative for difficulty urinating.  Musculoskeletal:  Positive for arthralgias and myalgias. Negative for back pain, neck pain and neck stiffness.  Skin:  Negative for pallor.  Neurological:  Positive for headaches. Negative for dizziness, speech difficulty and weakness.  Psychiatric/Behavioral:  Negative for confusion.    Physical Exam Updated Vital Signs BP (!) 148/87   Pulse 66   Temp 99 F (37.2 C) (Oral)   Resp 20   Ht 5\' 9"  (1.753 m)   Wt 78.5 kg   SpO2 99%   BMI 25.55 kg/m   Physical Exam Constitutional:      General: He is not in acute distress.    Appearance: Normal appearance. He is not ill-appearing, toxic-appearing or diaphoretic.     Comments: Appears well.   HENT:     Mouth/Throat:     Mouth: Mucous membranes are moist.     Pharynx: Oropharynx is clear.  Eyes:     General: No scleral icterus.    Extraocular Movements: Extraocular movements intact.     Pupils: Pupils are equal, round, and reactive to light.  Neck:     Comments: No meningismus.  Cardiovascular:     Rate and Rhythm: Normal rate and regular rhythm.     Pulses: Normal pulses.     Heart sounds: Normal heart sounds.  Pulmonary:     Effort: Pulmonary effort is normal. No respiratory distress.     Breath sounds: Normal breath sounds. No stridor. No wheezing, rhonchi or rales.  Chest:     Chest wall: No tenderness.  Abdominal:     General: Abdomen is flat. There is no distension.     Palpations: Abdomen is soft.     Tenderness: There is no abdominal tenderness. There is no guarding or rebound.  Musculoskeletal:        General: No swelling or tenderness. Normal range of motion.     Cervical back: Normal range of motion and neck supple. No rigidity.     Right lower leg: No edema.     Left lower leg: No edema.  Skin:    General: Skin is warm and dry.     Capillary  Refill: Capillary refill takes less than 2 seconds.     Coloration: Skin is not pale.  Neurological:     General: No focal deficit present.     Mental Status: He is alert and oriented to person, place, and time.  Psychiatric:        Mood and Affect: Mood normal.        Behavior: Behavior normal.    ED Results / Procedures / Treatments   Labs (all labs ordered are listed, but only abnormal results are displayed) Labs Reviewed  COMPREHENSIVE METABOLIC PANEL - Abnormal; Notable for the following components:  Result Value   Glucose, Bld 142 (*)    Total Bilirubin 1.4 (*)    All other components within normal limits  LIPASE, BLOOD  CBC  URINALYSIS, ROUTINE W REFLEX MICROSCOPIC    EKG EKG Interpretation  Date/Time:  Sunday May 01 2021 14:24:23 EDT Ventricular Rate:  66 PR Interval:  179 QRS Duration: 94 QT Interval:  374 QTC Calculation: 392 R Axis:   -20 Text Interpretation: Sinus rhythm Borderline left axis deviation Anterior infarct, possibly acute Left ventricular hypertrophy No significant change since last tracing Confirmed by Gwyneth Sprout ([id number]) on 05/01/2021 2:26:45 PM  Radiology DG Chest Port 1 View  Result Date: 05/01/2021 CLINICAL DATA:  Patient with generalized body aches, nausea and fevers. EXAM: PORTABLE CHEST 1 VIEW COMPARISON:  Chest radiograph March 12, 2018 FINDINGS: Monitoring leads overlie the patient. Stable cardiac and mediastinal contours. Minimal heterogeneous opacities right lung base. No pleural effusion or pneumothorax. Osseous structures unremarkable. IMPRESSION: Heterogeneous opacities right lung base may represent atelectasis or infection. Followup PA and lateral chest X-ray is recommended in 3-4 weeks to ensure resolution. Electronically Signed   By: Annia Belt M.D.   On: 05/01/2021 15:19    Procedures Procedures   Medications Ordered in ED Medications  acetaminophen (TYLENOL) tablet 650 mg (650 mg Oral Given 05/01/21 1425)   ondansetron (ZOFRAN-ODT) disintegrating tablet 4 mg (4 mg Oral Given 05/01/21 1426)    ED Course  I have reviewed the triage vital signs and the nursing notes.  Pertinent labs & imaging results that were available during my care of the patient were reviewed by me and considered in my medical decision making (see chart for details).    MDM Rules/Calculators/A&P                         Joel Young is a 65 y.o. male with past medical history of hypertension and hypercholesterol that presents to the emergency department today for COVID symptoms.  Patient appears very well, nontoxic-appearing.  No distress, primarily here because of his prolonged COVID symptoms.  Patient is out of window for antivirals, he is on day 6-7 of symptoms.  Work-up today reassuring, no leukocytosis.  EKG interpreted by Dr. Anitra Lauth appears similar to previous.  Chest x-ray interpreted without any signs of infection, the radiologist does say that there is possible for atelectasis versus infection on right lower lung base, discussed with Dr. Anitra Lauth, did not think that we need to treat this at this time.  Patient without fever and mild cough, most likely from COVID.  Does not appear as if patient has pneumonia, however did discuss that patient did have repeat x-ray in 4 weeks, patient agreeable with plan.  Upon reevaluation patient states that he feels much better with Zofran and Tylenol on board, symptomatic treatment discussed.  Patient be discharged.   Doubt need for further emergent work up at this time. I explained the diagnosis and have given explicit precautions to return to the ER including for any other new or worsening symptoms. The patient understands and accepts the medical plan as it's been dictated and I have answered their questions. Discharge instructions concerning home care and prescriptions have been given. The patient is STABLE and is discharged to home in good condition.  Final Clinical Impression(s) /  ED Diagnoses Final diagnoses:  COVID    Rx / DC Orders ED Discharge Orders          Ordered    ondansetron [facility]  ODT) 4 MG disintegrating tablet  Every 8 hours PRN        05/01/21 1546             Farrel Gordonatel, Trevone Prestwood, PA-C 05/01/21 1549    Gwyneth SproutPlunkett, [place], MD 05/02/21 1454

## 2021-05-27 ENCOUNTER — Emergency Department (HOSPITAL_BASED_OUTPATIENT_CLINIC_OR_DEPARTMENT_OTHER): Payer: Managed Care, Other (non HMO)

## 2021-05-27 ENCOUNTER — Emergency Department (HOSPITAL_BASED_OUTPATIENT_CLINIC_OR_DEPARTMENT_OTHER)
Admission: EM | Admit: 2021-05-27 | Discharge: 2021-05-27 | Disposition: A | Discharge status: Home / Self Care | Payer: Managed Care, Other (non HMO) | Attending: Emergency Medicine | Admitting: Emergency Medicine

## 2021-05-27 ENCOUNTER — Encounter (HOSPITAL_BASED_OUTPATIENT_CLINIC_OR_DEPARTMENT_OTHER): Payer: Self-pay | Admitting: Emergency Medicine

## 2021-05-27 ENCOUNTER — Other Ambulatory Visit: Payer: Self-pay

## 2021-05-27 DIAGNOSIS — Z79899 Other long term (current) drug therapy: Secondary | ICD-10-CM | POA: Diagnosis not present

## 2021-05-27 DIAGNOSIS — I10 Essential (primary) hypertension: Secondary | ICD-10-CM | POA: Insufficient documentation

## 2021-05-27 DIAGNOSIS — R531 Weakness: Secondary | ICD-10-CM | POA: Insufficient documentation

## 2021-05-27 DIAGNOSIS — R002 Palpitations: Secondary | ICD-10-CM | POA: Diagnosis not present

## 2021-05-27 DIAGNOSIS — Z8616 Personal history of COVID-19: Secondary | ICD-10-CM | POA: Diagnosis not present

## 2021-05-27 LAB — COMPREHENSIVE METABOLIC PANEL
ALT: 16 U/L (ref 0–44)
AST: 18 U/L (ref 15–41)
Albumin: 4.4 g/dL (ref 3.5–5.0)
Alkaline Phosphatase: 49 U/L (ref 38–126)
Anion gap: 4 — ABNORMAL LOW (ref 5–15)
BUN: 16 mg/dL (ref 8–23)
CO2: 29 mmol/L (ref 22–32)
Calcium: 9.2 mg/dL (ref 8.9–10.3)
Chloride: 104 mmol/L (ref 98–111)
Creatinine, Ser: 1.04 mg/dL (ref 0.61–1.24)
GFR, Estimated: >60 mL/min (ref >60)
Glucose, Bld: 102 mg/dL — ABNORMAL HIGH (ref 70–99)
Potassium: 3.8 mmol/L (ref 3.5–5.1)
Sodium: 137 mmol/L (ref 135–145)
Total Bilirubin: 2 mg/dL — ABNORMAL HIGH (ref 0.3–1.2)
Total Protein: 7.4 g/dL (ref 6.5–8.1)

## 2021-05-27 LAB — CBC WITH DIFFERENTIAL/PLATELET
Abs Immature Granulocytes: 0.01 10*3/uL (ref 0.00–0.07)
Basophils Absolute: 0 10*3/uL (ref 0.0–0.1)
Basophils Relative: 0 %
Eosinophils Absolute: 0.1 10*3/uL (ref 0.0–0.5)
Eosinophils Relative: 1 %
HCT: 45.9 % (ref 39.0–52.0)
Hemoglobin: 15.7 g/dL (ref 13.0–17.0)
Immature Granulocytes: 0 %
Lymphocytes Relative: 24 %
Lymphs Abs: 1.2 10*3/uL (ref 0.7–4.0)
MCH: 31.5 pg (ref 26.0–34.0)
MCHC: 34.2 g/dL (ref 30.0–36.0)
MCV: 92 fL (ref 80.0–100.0)
Monocytes Absolute: 0.4 10*3/uL (ref 0.1–1.0)
Monocytes Relative: 8 %
Neutro Abs: 3.5 10*3/uL (ref 1.7–7.7)
Neutrophils Relative %: 67 %
Platelets: 158 10*3/uL (ref 150–400)
RBC: 4.99 MIL/uL (ref 4.22–5.81)
RDW: 13.7 % (ref 11.5–15.5)
WBC: 5.3 10*3/uL (ref 4.0–10.5)
nRBC: 0 % (ref 0.0–0.2)

## 2021-05-27 LAB — TROPONIN I (HIGH SENSITIVITY)
Troponin I (High Sensitivity): 32 ng/L — ABNORMAL HIGH (ref <18)
Troponin I (High Sensitivity): 61 ng/L — ABNORMAL HIGH (ref <18)
Troponin I (High Sensitivity): 63 ng/L — ABNORMAL HIGH (ref <18)

## 2021-05-27 MED ORDER — LACTATED RINGERS IV BOLUS
1000.0000 mL | Freq: Once | INTRAVENOUS | Status: AC
  Administered 2021-05-27: 1000 mL via INTRAVENOUS

## 2021-05-27 MED ORDER — METOPROLOL SUCCINATE ER 25 MG PO TB24
50.0000 mg | ORAL_TABLET | Freq: Every day | ORAL | Status: DC
  Administered 2021-05-27: 25 mg via ORAL
  Filled 2021-05-27: qty 2

## 2021-05-27 NOTE — ED Notes (Signed)
Pt voided via urinal w/o difficulty

## 2021-05-27 NOTE — ED Triage Notes (Signed)
Pt reports when waking this morning his HR was 130 and felt weak. Pt reports "I no longer feel like it is racing."

## 2021-05-27 NOTE — ED Notes (Signed)
Graham crackers, milk and water provided to pt

## 2021-05-27 NOTE — ED Provider Notes (Signed)
[facility] EMERGENCY DEPARTMENT Provider Note   CSN: [id number] Arrival date & time: 05/27/21  0709     History Chief Complaint  Patient presents with   Tachycardia    Joel Young is a 64 y.o. male.  Patient is a 65 year old male with a history of hypertension, high cholesterol and GERD who is presenting today due to feeling weak and having heart palpitations.  He reports that over the last 2 days he has felt intermittently not himself.  Has had a minimal appetite and just not felt good but did not have any focal symptoms.  This morning he got up to go to the bathroom and as he was coming back from the bathroom he started feeling weak all over he then started to feel like his heart was racing.  He checked his heart rate and it was up to 130.  This lasted for about 20 to 30 minutes and by the time he arrived at the hospital with the symptoms went away.  Currently he states he feels pretty good.  He denied any chest pain during this event or currently.  He has not had recent cough, fever or congestion.  He did have COVID approximately 1 month ago but recovered without incident.  He has not had any abdominal pain nausea vomiting or diarrhea.  He has had no recent medication changes.  He does take Toprol daily but took his dose late yesterday and ended up taking it at 2 AM this morning.  He has no prior history of dysrhythmia.  No recent medication changes.  Denies excessive caffeine use.  No urinary complaints.  No recent travel or unilateral leg pain or swelling.  The history is provided by the patient.      Past Medical History:  Diagnosis Date   Elevated PSA    GERD (gastroesophageal reflux disease)    High cholesterol    Hypertension     There are no problems to display for this patient.   Past Surgical History:  Procedure Laterality Date   APPENDECTOMY     BACK SURGERY     two back surgeries to help with sciatica       Family History  Problem Relation Age of  Onset   Prostate cancer Father 56    Social History   Tobacco Use   Smoking status: Never   Smokeless tobacco: Never  Substance Use Topics   Alcohol use: Not Currently   Drug use: No    Home Medications Prior to Admission medications   Medication Sig Start Date End Date Taking? Authorizing Provider  cholecalciferol (VITAMIN D) 1000 units tablet Take 1,000 Units by mouth daily.    [provider]  diclofenac (VOLTAREN) 75 MG EC tablet Take 1 tablet (75 mg total) by mouth 2 (two) times daily. Patient not taking: Reported on 06/01/2020 12/21/17   Sharlene Dory, DO  doxycycline (VIBRA-TABS) 100 MG tablet Take 1 tablet (100 mg total) by mouth 2 (two) times daily. 06/01/20   Saguier, Ramon Dredge, PA-C  finasteride (PROSCAR) 5 MG tablet  12/02/17   [provider]  metoprolol succinate (TOPROL-XL) 50 MG 24 hr tablet Take 50 mg by mouth daily.     [provider]  ondansetron (ZOFRAN ODT) 4 MG disintegrating tablet Take 1 tablet (4 mg total) by mouth every 8 (eight) hours as needed for nausea or vomiting. 05/01/21   Farrel Gordon, PA-C  potassium chloride (K-DUR,KLOR-CON) 10 MEQ tablet Take 10 mEq by mouth every  other day.     [provider]  rosuvastatin (CRESTOR) 5 MG tablet  11/12/17   [provider]  triamterene-hydrochlorothiazide (MAXZIDE-25) 37.5-25 MG per tablet Take 0.5 tablets by mouth every other day.     [provider]    Allergies    Caffeine, Ciprofloxacin, Famotidine, and Keflex [cephalexin]  Review of Systems   Review of Systems  All other systems reviewed and are negative.  Physical Exam Updated Vital Signs BP 126/86   Pulse 64   Temp 98.5 F (36.9 C) (Oral)   Resp 16   SpO2 97%   Physical Exam Vitals and nursing note reviewed.  Constitutional:      General: He is not in acute distress.    Appearance: He is well-developed.  HENT:     Head: Normocephalic and atraumatic.     Mouth/Throat:     Mouth:  Mucous membranes are moist.  Eyes:     Conjunctiva/sclera: Conjunctivae normal.     Pupils: Pupils are equal, round, and reactive to light.  Cardiovascular:     Rate and Rhythm: Normal rate and regular rhythm.     Pulses: Normal pulses.     Heart sounds: No murmur heard. Pulmonary:     Effort: Pulmonary effort is normal. No respiratory distress.     Breath sounds: Normal breath sounds. No wheezing or rales.  Abdominal:     General: There is no distension.     Palpations: Abdomen is soft.     Tenderness: There is no abdominal tenderness. There is no guarding or rebound.  Musculoskeletal:        General: No tenderness. Normal range of motion.     Cervical back: Normal range of motion and neck supple.     Right lower leg: No edema.     Left lower leg: No edema.  Skin:    General: Skin is warm and dry.     Findings: No erythema or rash.  Neurological:     Mental Status: He is alert and oriented to person, place, and time. Mental status is at baseline.  Psychiatric:        Mood and Affect: Mood normal.        Behavior: Behavior normal.    ED Results / Procedures / Treatments   Labs (all labs ordered are listed, but only abnormal results are displayed) Labs Reviewed  COMPREHENSIVE METABOLIC PANEL - Abnormal; Notable for the following components:      Result Value   Glucose, Bld 102 (*)    Total Bilirubin 2.0 (*)    Anion gap 4 (*)    All other components within normal limits  TROPONIN I (HIGH SENSITIVITY) - Abnormal; Notable for the following components:   Troponin I (High Sensitivity) 32 (*)    All other components within normal limits  TROPONIN I (HIGH SENSITIVITY) - Abnormal; Notable for the following components:   Troponin I (High Sensitivity) 61 (*)    All other components within normal limits  CBC WITH DIFFERENTIAL/PLATELET    EKG EKG Interpretation  Date/Time:  Friday May 27 2021 08:51:46 EDT Ventricular Rate:  71 PR Interval:  181 QRS Duration: 98 QT  Interval:  392 QTC Calculation: 426 R Axis:   -26 Text Interpretation: Sinus rhythm Ventricular premature complex Aberrant conduction of SV complex(es) Probable left atrial enlargement Borderline left axis deviation Anteroseptal infarct, old No significant change since last tracing Confirmed by Gwyneth Sprout ([id number]) on 05/27/2021 9:24:08 AM  Radiology No results found.  Procedures Procedures   Medications Ordered in ED Medications - No data to display  ED Course  I have reviewed the triage vital signs and the nursing notes.  Pertinent labs & imaging results that were available during my care of the patient were reviewed by me and considered in my medical decision making (see chart for details).    MDM Rules/Calculators/A&P                          65 year old male presenting today with weakness and palpitations.  These resolved prior to him being seen after he came to the emergency room.  However when he checked his heart rate at home it was in the 130s.  Patient is well-appearing at this time however reports over the last few days he has not felt himself.  Denies any infectious symptoms.  Exam currently is normal.  Heart rate in the 60s.  EKG unchanged and blood pressure within normal limits.  He does take Toprol daily but took his dose late yesterday.  Possibility for SVT or atrial fibrillation but unable to tell as patient symptoms resolved prior to EKG or monitor.  Patient has had his thyroid checked within the last 6 months that was normal per his report.  12:23 PM Patient's labs are reassuring with a normal CBC, CMP and chest x-ray however initial troponin was 32 and second delta troponin was 61.  Possibly this could be from strain from rapid heartbeat as patient on reevaluation reiterated that he had no chest pain or discomfort just the general feeling of weakness and not feeling well yesterday.  EKG is not changed.  We will check a third troponin to ensure that the numbers do not  continue to elevate.  2:26 PM 3rd trop is flat.  Suspect this was leak from dysrhythmia earlier this morning.  Patient remains asymptomatic.  We will discharge patient home but encourage follow-up with PCP for evaluation and possible heart monitor.  Patient was cautioned to return if he has any return of his symptoms.  He was able to ambulate here without symptoms.  Final Clinical Impression(s) / ED Diagnoses Final diagnoses:  Palpitations    Rx / DC Orders ED Discharge Orders     None        Gwyneth Sprout, MD 05/27/21 1434

## 2021-05-27 NOTE — Discharge Instructions (Addendum)
Your blood work did show a mild elevation of your heart markers but they did not go up any further on the third 1.  It is most likely from your heart going fast earlier today.  You could have been in an abnormal rhythm for a short period of time but it went away before you could get here.  You need to call your doctor so they can set you up with a monitor in the future so if it happens again they can capture it.  Continue taking your medicine and you can take your other medicines when you get home today.  If you start having chest pain, shortness of breath or the palpitations return and they do not go away please return to the emergency room.

## 2021-06-09 ENCOUNTER — Emergency Department (HOSPITAL_BASED_OUTPATIENT_CLINIC_OR_DEPARTMENT_OTHER)
Admission: EM | Admit: 2021-06-09 | Discharge: 2021-06-09 | Disposition: A | Discharge status: Home / Self Care | Payer: Managed Care, Other (non HMO) | Attending: Emergency Medicine | Admitting: Emergency Medicine

## 2021-06-09 ENCOUNTER — Other Ambulatory Visit: Payer: Self-pay

## 2021-06-09 ENCOUNTER — Emergency Department (HOSPITAL_BASED_OUTPATIENT_CLINIC_OR_DEPARTMENT_OTHER): Payer: Managed Care, Other (non HMO)

## 2021-06-09 ENCOUNTER — Encounter (HOSPITAL_BASED_OUTPATIENT_CLINIC_OR_DEPARTMENT_OTHER): Payer: Self-pay | Admitting: Emergency Medicine

## 2021-06-09 DIAGNOSIS — R066 Hiccough: Secondary | ICD-10-CM | POA: Diagnosis present

## 2021-06-09 DIAGNOSIS — I10 Essential (primary) hypertension: Secondary | ICD-10-CM | POA: Diagnosis not present

## 2021-06-09 DIAGNOSIS — Z79899 Other long term (current) drug therapy: Secondary | ICD-10-CM | POA: Diagnosis not present

## 2021-06-09 MED ORDER — CHLORPROMAZINE HCL 10 MG PO TABS: 10.0000 mg | ORAL_TABLET | Freq: Three times a day (TID) | ORAL | 0 refills | Status: DC | PRN

## 2021-06-09 MED ORDER — ALUM & MAG HYDROXIDE-SIMETH 200-200-20 MG/5ML PO SUSP
30.0000 mL | Freq: Once | ORAL | Status: AC
  Administered 2021-06-09: 30 mL via ORAL
  Filled 2021-06-09: qty 30

## 2021-06-09 NOTE — ED Notes (Signed)
AVS reviewed with client, copy of AVS also provided. Informed client of Rx that was electronically sent to the pharmacy listed on his EMR. Opportunity for questions provided.

## 2021-06-09 NOTE — ED Triage Notes (Signed)
Pt states intermittent  Hiccups and difficulty swallowing X 1 week.

## 2021-06-09 NOTE — Discharge Instructions (Addendum)
Follow up with your doctor in the office.  They may need to do further testing for this.    Try pepcid or tagamet up to twice a day.  Try to avoid things that may make this worse, most commonly these are spicy foods tomato based products fatty foods chocolate and peppermint.  Alcohol and tobacco can also make this worse.  Return to the emergency department for sudden worsening pain fever or inability to eat or drink.

## 2021-06-09 NOTE — ED Provider Notes (Signed)
[facility] EMERGENCY DEPARTMENT Provider Note   CSN: [id number] Arrival date & time: 06/09/21  0516     History Chief Complaint  Patient presents with   Hiccups   Dysphagia    Joel Young is a 65 y.o. male.  65 yo M with a chief complaints of hiccups.  This has been off and on for the past week.  Seems to come and go at random.  Patient sometimes will have it last for almost all day and other times hold have any symptoms.  Nothing seems to make this better or worse.  Had hiccups occur in the middle the night and so came to the ED but had them resolved just prior to arrival.  When he has hiccups he has trouble swallowing.  He denies any symptoms when he does not have the hiccups.  Specifically he denies difficulty swallowing chest pain shortness of breath fever.  Denies abdominal pain.  The history is provided by the patient.  Illness Severity:  Moderate Onset quality:  Gradual Duration:  1 week Timing:  Intermittent Progression:  Waxing and waning Chronicity:  New Associated symptoms: no abdominal pain, no chest pain, no congestion, no diarrhea, no fever, no headaches, no myalgias, no rash, no shortness of breath and no vomiting       Past Medical History:  Diagnosis Date   Elevated PSA    GERD (gastroesophageal reflux disease)    High cholesterol    Hypertension     There are no problems to display for this patient.   Past Surgical History:  Procedure Laterality Date   APPENDECTOMY     BACK SURGERY     two back surgeries to help with sciatica       Family History  Problem Relation Age of Onset   Prostate cancer Father 75    Social History   Tobacco Use   Smoking status: Never   Smokeless tobacco: Never  Substance Use Topics   Alcohol use: Not Currently   Drug use: No    Home Medications Prior to Admission medications   Medication Sig Start Date End Date Taking? Authorizing Provider  chlorproMAZINE (THORAZINE) 10 MG tablet Take 1  tablet (10 mg total) by mouth 3 (three) times daily as needed for up to 5 days for hiccoughs. 06/09/21 06/14/21 Yes Melene Plan, DO  cholecalciferol (VITAMIN D) 1000 units tablet Take 1,000 Units by mouth daily.    [provider]  diclofenac (VOLTAREN) 75 MG EC tablet Take 1 tablet (75 mg total) by mouth 2 (two) times daily. Patient not taking: No sig reported 12/21/17   Sharlene Dory, DO  doxycycline (VIBRA-TABS) 100 MG tablet Take 1 tablet (100 mg total) by mouth 2 (two) times daily. 06/01/20   Saguier, Ramon Dredge, PA-C  finasteride (PROSCAR) 5 MG tablet  12/02/17   [provider]  metoprolol succinate (TOPROL-XL) 50 MG 24 hr tablet Take 50 mg by mouth daily.     [provider]  ondansetron (ZOFRAN ODT) 4 MG disintegrating tablet Take 1 tablet (4 mg total) by mouth every 8 (eight) hours as needed for nausea or vomiting. 05/01/21   Farrel Gordon, PA-C  potassium chloride (K-DUR,KLOR-CON) 10 MEQ tablet Take 10 mEq by mouth every other day.     [provider]  rosuvastatin (CRESTOR) 5 MG tablet  11/12/17   [provider]  triamterene-hydrochlorothiazide (MAXZIDE-25) 37.5-25 MG per tablet Take 0.5 tablets by mouth every other day.     [provider]    Allergies    Caffeine, Ciprofloxacin, Famotidine, and Keflex [cephalexin]  Review of Systems   Review of Systems  Constitutional:  Negative for chills and fever.  HENT:  Positive for trouble swallowing. Negative for congestion and facial swelling.   Eyes:  Negative for discharge and visual disturbance.  Respiratory:  Negative for shortness of breath.   Cardiovascular:  Negative for chest pain and palpitations.  Gastrointestinal:  Negative for abdominal pain, diarrhea and vomiting.  Musculoskeletal:  Negative for arthralgias and myalgias.  Skin:  Negative for color change and rash.  Neurological:  Negative for tremors, syncope and headaches.  Psychiatric/Behavioral:  Negative for  confusion and dysphoric mood.    Physical Exam Updated Vital Signs BP (!) 145/89 (BP Location: Left Arm)   Pulse 62   Temp 98.1 F (36.7 C) (Oral)   Resp 14   Ht 5\' 9"  (1.753 m)   Wt 76.7 kg   SpO2 100%   BMI 24.96 kg/m   Physical Exam Vitals and nursing note reviewed.  Constitutional:      Appearance: He is well-developed.  HENT:     Head: Normocephalic and atraumatic.  Eyes:     Pupils: Pupils are equal, round, and reactive to light.  Neck:     Vascular: No JVD.  Cardiovascular:     Rate and Rhythm: Normal rate and regular rhythm.     Heart sounds: No murmur heard.   No friction rub. No gallop.  Pulmonary:     Effort: No respiratory distress.     Breath sounds: No wheezing.  Abdominal:     General: There is no distension.     Tenderness: There is no abdominal tenderness. There is no guarding or rebound.  Musculoskeletal:        General: Normal range of motion.     Cervical back: Normal range of motion and neck supple.  Skin:    Coloration: Skin is not pale.     Findings: No rash.  Neurological:     Mental Status: He is alert and oriented to person, place, and time.  Psychiatric:        Behavior: Behavior normal.    ED Results / Procedures / Treatments   Labs (all labs ordered are listed, but only abnormal results are displayed) Labs Reviewed - No data to display  EKG None  Radiology DG Chest [facility] 1 View  Result Date: 06/09/2021 CLINICAL DATA:  Singultus.  History of GERD and hypertension. EXAM: PORTABLE CHEST 1 VIEW COMPARISON:  05/27/2021. FINDINGS: Mediastinum and hilar structures normal. Stable cardiomegaly. No pulmonary venous congestion. No focal infiltrate. No pleural effusion or pneumothorax. Degenerative changes scoliosis thoracic spine. IMPRESSION: Stable cardiomegaly. No pulmonary venous congestion. No acute pulmonary disease. Electronically Signed   By: 05/29/2021  Register   On: 06/09/2021 07:05    Procedures Procedures   Medications Ordered in  ED Medications  alum & mag hydroxide-simeth (MAALOX/MYLANTA) 200-200-20 MG/5ML suspension 30 mL (30 mLs Oral Given 06/09/21 0541)    ED Course  I have reviewed the triage vital signs and the nursing notes.  Pertinent labs & imaging results that were available during my care of the patient were reviewed by me and considered in my medical decision making (see chart for details).    MDM Rules/Calculators/A&P                           65 yo M with a chief complaints of  intermittent hiccups.  Off and on for the past week.  Will obtain a screening chest x-ray here.  No obvious cause by history though seems to have some issue with food, will start on reflux treatment.  PCP follow-up.  7:10 AM:  I have discussed the diagnosis/risks/treatment options with the patient and believe the pt to be eligible for discharge home to follow-up with PCP. We also discussed returning to the ED immediately if new or worsening sx occur. We discussed the sx which are most concerning (e.g., sudden worsening pain, fever, inability to tolerate by mouth) that necessitate immediate return. Medications administered to the patient during their visit and any new prescriptions provided to the patient are listed below.  Medications given during this visit Medications  alum & mag hydroxide-simeth (MAALOX/MYLANTA) 200-200-20 MG/5ML suspension 30 mL (30 mLs Oral Given 06/09/21 0541)     The patient appears reasonably screen and/or stabilized for discharge and I doubt any other medical condition or other [facility] requiring further screening, evaluation, or treatment in the ED at this time prior to discharge.   Final Clinical Impression(s) / ED Diagnoses Final diagnoses:  Singultus  Hiccups    Rx / DC Orders ED Discharge Orders          Ordered    chlorproMAZINE (THORAZINE) 10 MG tablet  3 times daily PRN        06/09/21 0535             Melene Plan, DO 06/09/21 0710

## 2021-06-10 ENCOUNTER — Ambulatory Visit (INDEPENDENT_AMBULATORY_CARE_PROVIDER_SITE_OTHER): Payer: Managed Care, Other (non HMO) | Admitting: Family Medicine

## 2021-06-10 ENCOUNTER — Encounter: Payer: Self-pay | Admitting: Family Medicine

## 2021-06-10 ENCOUNTER — Other Ambulatory Visit: Payer: Self-pay

## 2021-06-10 VITALS — BP 122/72 | HR 63 | Temp 98.4°F | Ht 69.0 in | Wt 170.4 lb

## 2021-06-10 DIAGNOSIS — M62838 Other muscle spasm: Secondary | ICD-10-CM | POA: Diagnosis not present

## 2021-06-10 MED ORDER — BACLOFEN 10 MG PO TABS: 10.0000 mg | ORAL_TABLET | Freq: Two times a day (BID) | ORAL | 0 refills | Status: DC

## 2021-06-10 MED ORDER — METOPROLOL SUCCINATE ER 50 MG PO TB24: 50.0000 mg | ORAL_TABLET | Freq: Every day | ORAL | Status: DC

## 2021-06-10 NOTE — Patient Instructions (Addendum)
Heat (pad or rice pillow in microwave) over affected area, 10-15 minutes twice daily.   OK to take Tylenol 1000 mg (2 extra strength tabs) or 975 mg (3 regular strength tabs) every 6 hours as needed.  Ice/cold pack over area for 10-15 min twice daily.  If things are getting better, please cancel the appointment.   Give [place] 2-3 business days to get the results of your labs back.   EXERCISES RANGE OF MOTION (ROM) AND STRETCHING EXERCISES  These exercises may help you when beginning to rehabilitate your issue. In order to successfully resolve your symptoms, you must improve your posture. These exercises are designed to help reduce the forward-head and rounded-shoulder posture which contributes to this condition. Your symptoms may resolve with or without further involvement from your physician, physical therapist or athletic trainer. While completing these exercises, remember:  Restoring tissue flexibility helps normal motion to return to the joints. This allows healthier, less painful movement and activity. An effective stretch should be held for at least 20 seconds, although you may need to begin with shorter hold times for comfort. A stretch should never be painful. You should only feel a gentle lengthening or release in the stretched tissue. Do not do any stretch or exercise that you cannot tolerate.  STRETCH- Axial Extensors Lie on your back on the floor. You may bend your knees for comfort. Place a rolled-up hand towel or dish towel, about 2 inches in diameter, under the part of your head that makes contact with the floor. Gently tuck your chin, as if trying to make a "double chin," until you feel a gentle stretch at the base of your head. Hold 15-20 seconds. Repeat 2-3 times. Complete this exercise 1 time per day.   STRETCH - Axial Extension  Stand or sit on a firm surface. Assume a good posture: chest up, shoulders drawn back, abdominal muscles slightly tense, knees unlocked (if standing)  and feet hip width apart. Slowly retract your chin so your head slides back and your chin slightly lowers. Continue to look straight ahead. You should feel a gentle stretch in the back of your head. Be certain not to feel an aggressive stretch since this can cause headaches later. Hold for 15-20 seconds. Repeat 2-3 times. Complete this exercise 1 time per day.  STRETCH - Cervical Side Bend  Stand or sit on a firm surface. Assume a good posture: chest up, shoulders drawn back, abdominal muscles slightly tense, knees unlocked (if standing) and feet hip width apart. Without letting your nose or shoulders move, slowly tip your right / left ear to your shoulder until your feel a gentle stretch in the muscles on the opposite side of your neck. Hold 15-20 seconds. Repeat 2-3 times. Complete this exercise 1-2 times per day.  STRETCH - Cervical Rotators  Stand or sit on a firm surface. Assume a good posture: chest up, shoulders drawn back, abdominal muscles slightly tense, knees unlocked (if standing) and feet hip width apart. Keeping your eyes level with the ground, slowly turn your head until you feel a gentle stretch along the back and opposite side of your neck. Hold 15-20 seconds. Repeat 2-3 times. Complete this exercise 1-2 times per day.  RANGE OF MOTION - Neck Circles  Stand or sit on a firm surface. Assume a good posture: chest up, shoulders drawn back, abdominal muscles slightly tense, knees unlocked (if standing) and feet hip width apart. Gently roll your head down and around from the back of one  shoulder to the back of the other. The motion should never be forced or painful. Repeat the motion 10-20 times, or until you feel the neck muscles relax and loosen. Repeat 2-3 times. Complete the exercise 1-2 times per day. STRENGTHENING EXERCISES - Cervical Strain and Sprain These exercises may help you when beginning to rehabilitate your injury. They may resolve your symptoms with or without  further involvement from your physician, physical therapist, or athletic trainer. While completing these exercises, remember:  Muscles can gain both the endurance and the strength needed for everyday activities through controlled exercises. Complete these exercises as instructed by your physician, physical therapist, or athletic trainer. Progress the resistance and repetitions only as guided. You may experience muscle soreness or fatigue, but the pain or discomfort you are trying to eliminate should never worsen during these exercises. If this pain does worsen, stop and make certain you are following the directions exactly. If the pain is still present after adjustments, discontinue the exercise until you can discuss the trouble with your clinician.  STRENGTH - Cervical Flexors, Isometric Face a wall, standing about 6 inches away. Place a small pillow, a ball about 6-8 inches in diameter, or a folded towel between your forehead and the wall. Slightly tuck your chin and gently push your forehead into the soft object. Push only with mild to moderate intensity, building up tension gradually. Keep your jaw and forehead relaxed. Hold 10 to 20 seconds. Keep your breathing relaxed. Release the tension slowly. Relax your neck muscles completely before you start the next repetition. Repeat 2-3 times. Complete this exercise 1 time per day.  STRENGTH- Cervical Lateral Flexors, Isometric  Stand about 6 inches away from a wall. Place a small pillow, a ball about 6-8 inches in diameter, or a folded towel between the side of your head and the wall. Slightly tuck your chin and gently tilt your head into the soft object. Push only with mild to moderate intensity, building up tension gradually. Keep your jaw and forehead relaxed. Hold 10 to 20 seconds. Keep your breathing relaxed. Release the tension slowly. Relax your neck muscles completely before you start the next repetition. Repeat 2-3 times. Complete this  exercise 1 time per day.  STRENGTH - Cervical Extensors, Isometric  Stand about 6 inches away from a wall. Place a small pillow, a ball about 6-8 inches in diameter, or a folded towel between the back of your head and the wall. Slightly tuck your chin and gently tilt your head back into the soft object. Push only with mild to moderate intensity, building up tension gradually. Keep your jaw and forehead relaxed. Hold 10 to 20 seconds. Keep your breathing relaxed. Release the tension slowly. Relax your neck muscles completely before you start the next repetition. Repeat 2-3 times. Complete this exercise 1 time per day.  POSTURE AND BODY MECHANICS CONSIDERATIONS Keeping correct posture when sitting, standing or completing your activities will reduce the stress put on different body tissues, allowing injured tissues a chance to heal and limiting painful experiences. The following are general guidelines for improved posture. Your physician or physical therapist will provide you with any instructions specific to your needs. While reading these guidelines, remember: The exercises prescribed by your provider will help you have the flexibility and strength to maintain correct postures. The correct posture provides the optimal environment for your joints to work. All of your joints have less wear and tear when properly supported by a spine with good posture. This means you  will experience a healthier, less painful body. Correct posture must be practiced with all of your activities, especially prolonged sitting and standing. Correct posture is as important when doing repetitive low-stress activities (typing) as it is when doing a single heavy-load activity (lifting).  PROLONGED STANDING WHILE SLIGHTLY LEANING FORWARD When completing a task that requires you to lean forward while standing in one place for a long time, place either foot up on a stationary 2- to 4-inch high object to help maintain the best  posture. When both feet are on the ground, the low back tends to lose its slight inward curve. If this curve flattens (or becomes too large), then the back and your other joints will experience too much stress, fatigue more quickly, and can cause pain.   RESTING POSITIONS Consider which positions are most painful for you when choosing a resting position. If you have pain with flexion-based activities (sitting, bending, stooping, squatting), choose a position that allows you to rest in a less flexed posture. You would want to avoid curling into a fetal position on your side. If your pain worsens with extension-based activities (prolonged standing, working overhead), avoid resting in an extended position such as sleeping on your stomach. Most people will find more comfort when they rest with their spine in a more neutral position, neither too rounded nor too arched. Lying on a non-sagging bed on your side with a pillow between your knees, or on your back with a pillow under your knees will often provide some relief. Keep in mind, being in any one position for a prolonged period of time, no matter how correct your posture, can still lead to stiffness.  WALKING Walk with an upright posture. Your ears, shoulders, and hips should all line up. OFFICE WORK When working at a desk, create an environment that supports good, upright posture. Without extra support, muscles fatigue and lead to excessive strain on joints and other tissues.  CHAIR: A chair should be able to slide under your desk when your back makes contact with the back of the chair. This allows you to work closely. The chair's height should allow your eyes to be level with the upper part of your monitor and your hands to be slightly lower than your elbows. Body position: Your feet should make contact with the floor. If this is not possible, use a foot rest. Keep your ears over your shoulders. This will reduce stress on your neck and low back.

## 2021-06-10 NOTE — Progress Notes (Signed)
Chief Complaint  Patient presents with   Hospitalization Follow-up    Subjective: Patient is a 65 y.o. male here for ED f/u.  Has had choking/difficulty swallowing over the past 2-3 months. He feels the neck muscles are tensing up and making it difficulty to swallow. He is not regurgitating or having any unexplained wt loss. It is not every time he swallows. No shortness of breath. Stays hydrated. The spasms lead to difficulty swallowing, not vice versa. Happens daily. Lasts around 30 min. No triggers or causes for it to happen.   Past Medical History:  Diagnosis Date   Elevated PSA    GERD (gastroesophageal reflux disease)    High cholesterol    Hypertension     Objective: BP 122/72   Pulse 63   Temp 98.4 F (36.9 C) (Oral)   Ht 5\' 9"  (1.753 m)   Wt 170 lb 6 oz (77.3 kg)   SpO2 96%   BMI 25.16 kg/m  General: Awake, appears stated age HEENT: MMM, no exudate or edema Heart: RRR, no murmurs Lungs: CTAB, no rales, wheezes or rhonchi. No accessory muscle use Neck: No thyromegaly or asymmetry MSK: No ttp over SCMs but does mention that is where the pain is when he has his issue Psych: Age appropriate judgment and insight, normal affect and mood  Assessment and Plan: Muscle spasms of neck - Plan: baclofen (LIORESAL) 10 MG tablet, Magnesium, TSH, T4, free  New problem, uncertain prog. Does not seem like pharyngeal/esoph dysphagia. Heat, ice, Tylenol, trial Baclofen. Stretches/exercises. Ck labs. F/u in 2 weeks if no better. The patient voiced understanding and agreement to the plan.  [place], DO 06/10/21  3:17 PM

## 2021-06-11 LAB — MAGNESIUM: Magnesium: 2.2 mg/dL (ref 1.5–2.5)

## 2021-06-11 LAB — TSH: TSH: 0.67 mIU/L (ref 0.40–4.50)

## 2021-06-11 LAB — T4, FREE: Free T4: 1.1 ng/dL (ref 0.8–1.8)

## 2021-07-06 ENCOUNTER — Ambulatory Visit: Payer: Managed Care, Other (non HMO) | Admitting: Family Medicine

## 2021-07-07 ENCOUNTER — Other Ambulatory Visit: Payer: Self-pay | Admitting: Family Medicine

## 2021-07-07 DIAGNOSIS — M62838 Other muscle spasm: Secondary | ICD-10-CM

## 2021-10-14 IMAGING — DX DG CHEST 1V PORT
1 series · 1 of 1 positions shown · non-contrast
Comparison: 05/27/2021.

CLINICAL DATA: [place].  History of GERD and hypertension.

EXAM:
PORTABLE CHEST 1 VIEW

[chest ap]
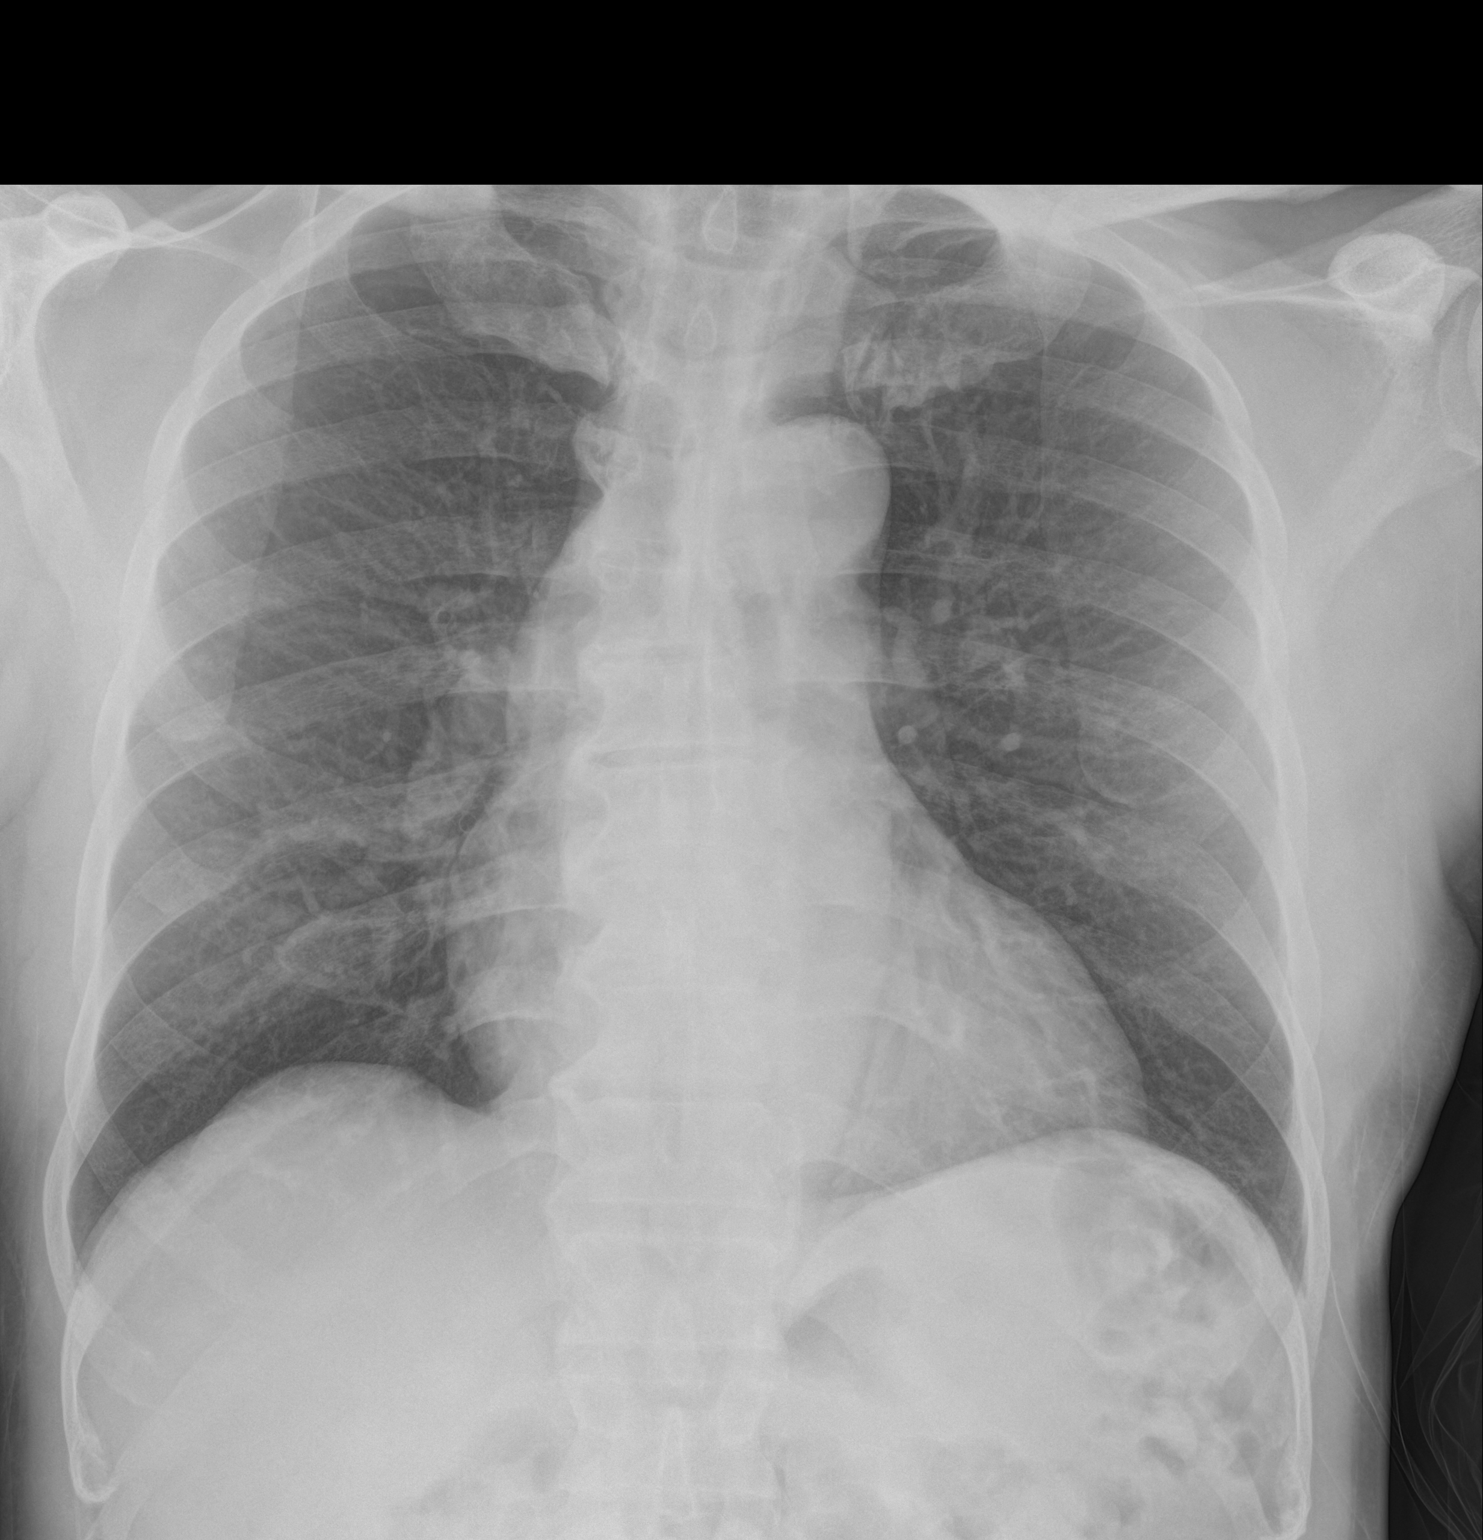

[1 of 1 positions shown; findings below may reference images not displayed]

FINDINGS: Mediastinum and hilar structures normal. Stable cardiomegaly. No
pulmonary venous congestion. No focal infiltrate. No pleural
effusion or pneumothorax. Degenerative changes scoliosis thoracic
spine.
IMPRESSION: Stable cardiomegaly. No pulmonary venous congestion. No acute
pulmonary disease.

## 2022-11-14 ENCOUNTER — Telehealth: Payer: Self-pay

## 2022-11-14 NOTE — Telephone Encounter (Signed)
Pt had an dentist appointment about 1 week ago and BP was 156/? (Does not remember the bottom number). He has checked 3 times since and all have averaged around 170/100.   Pt has been scheduled for tomorrow 11/15/22 at 11:15. Does he need to do anything in the meantime?

## 2022-11-14 NOTE — Telephone Encounter (Signed)
Tried calling the pt back twice, phone went to dead air twice.  Will try again.

## 2022-11-15 ENCOUNTER — Ambulatory Visit (INDEPENDENT_AMBULATORY_CARE_PROVIDER_SITE_OTHER): Payer: Managed Care, Other (non HMO) | Admitting: Family Medicine

## 2022-11-15 ENCOUNTER — Encounter: Payer: Self-pay | Admitting: Family Medicine

## 2022-11-15 VITALS — BP 146/84 | HR 64 | Temp 98.2°F | Ht 69.0 in | Wt 178.5 lb

## 2022-11-15 DIAGNOSIS — Z23 Encounter for immunization: Secondary | ICD-10-CM

## 2022-11-15 DIAGNOSIS — I10 Essential (primary) hypertension: Secondary | ICD-10-CM | POA: Diagnosis not present

## 2022-11-15 MED ORDER — TRIAMTERENE-HCTZ 37.5-25 MG PO TABS: 0.5000 | ORAL_TABLET | Freq: Every day | ORAL | 3 refills | Status: DC

## 2022-11-15 MED ORDER — POTASSIUM CHLORIDE CRYS ER 10 MEQ PO TBCR: 10.0000 meq | EXTENDED_RELEASE_TABLET | Freq: Every day | ORAL | 3 refills | Status: DC

## 2022-11-15 MED ORDER — METOPROLOL SUCCINATE ER 50 MG PO TB24: ORAL_TABLET | ORAL | Status: DC

## 2022-11-15 MED ORDER — AMLODIPINE BESYLATE 5 MG PO TABS: 5.0000 mg | ORAL_TABLET | Freq: Every day | ORAL | 3 refills | Status: DC

## 2022-11-15 NOTE — Addendum Note (Signed)
Addended by: Sharon Seller B on: 11/15/2022 12:48 PM   Modules accepted: Orders

## 2022-11-15 NOTE — Patient Instructions (Addendum)
Check your blood pressures 2-3 times per week, alternating the time of day you check it. If it is high, considering waiting 1-2 minutes and rechecking. If it gets higher, your anxiety is likely creeping up and we should avoid rechecking.   I want your blood pressure less than 140 on the top and less than 90 on the bottom consistently. Both goals must be met (ie, 150/70 is too high even though the 70 on the bottom is desirable).   Keep the diet clean and stay active.  Let us know if you need anything.

## 2022-11-15 NOTE — Progress Notes (Signed)
Chief Complaint  Patient presents with   Hypertension    Dentist informed him BP was elevated last week at his appointment    Subjective Joel Young is a 67 y.o. male who presents for high blood pressure. He does not monitor home blood pressures. BP was 150's at dentist.  He is compliant with medications- Toprol XL 50 mg in the evening and 25 mg in the morning, Maxzide 37.5-25 mg tab (1/2 tab daily). Patient has these side effects of medication: none He is usually adhering to a healthy diet overall. Current exercise: walking No CP or SOB.    Past Medical History:  Diagnosis Date   Elevated PSA    GERD (gastroesophageal reflux disease)    High cholesterol    Hypertension     Exam BP (!) 146/84 (BP Location: Left Arm, Cuff Size: Normal)   Pulse 64   Temp 98.2 F (36.8 C) (Oral)   Ht 5\' 9"  (1.753 m)   Wt 178 lb 8 oz (81 kg)   SpO2 99%   BMI 26.36 kg/m  General:  well developed, well nourished, in no apparent distress Heart: RRR, no bruits, no LE edema Lungs: clear to auscultation, no accessory muscle use Psych: well oriented with normal range of affect and appropriate judgment/insight  Essential hypertension - Plan: amLODipine (NORVASC) 5 MG tablet  Chronic, uncontrolled.  Add amlodipine 5 mg daily.  Continue Toprol 25 mg in the morning, 50 mg in the evening, Maxzide 37.5-25 mg will be a full tab daily now.  Monitor blood pressures at home.  Counseled on diet and exercise. F/u in 1 mo for CPE. The patient voiced understanding and agreement to the plan.  [place], DO 11/15/22  11:42 AM

## 2022-12-19 ENCOUNTER — Telehealth: Payer: Self-pay | Admitting: Pharmacist

## 2022-12-19 NOTE — Telephone Encounter (Signed)
Patient appearing on report for [employer] [place] [facility] - Hypertension Control report due to last documented ambulatory blood pressure of 146/84 on 11/15/2022. Next appointment with PCP is 12/20/2022   BP Readings from Last 3 Encounters:  11/15/22 (!) 146/84  06/10/21 122/72  06/09/21 (!) 145/89    Attemped to outreach patient to discuss hypertension control and medication management.   Current antihypertensives: amlodipine 5mg  daily (started 11/15/2022), metoprolol succinate ER 50mg  - take 0.5 tablet = 25mg  each morning and 1 tablet each evening; triametrene-hydrochlorothiazide daily.   Reviewed refill history - has filled blood pressure meds in January 2024 for 90 days. It does look like in the past that his dose of Triametrene- hydrochlorothiazide 37.5/25mg  was lower - 0.5 tablet every OTHER day but current dose is 0.5 tablet daily.   Dispenses   Dispensed Days Supply Quantity Provider Pharmacy  TRIAMTERENE-HCTZ 37.5-25 MG TB 11/15/2022 90 41 each Shelda Pal, [place] [id number]  TRIAMTERENE-HCTZ 37.5-25 MG TB 05/31/2022 90 15 each KIMBLE,CHELSEY BRIANNE [id number]  TRIAMTERENE-HCTZ 37.5-25 MG TB 02/26/2022 90 15 each KIMBLE,CHELSEY BRIANNE [id number]       How do dispenses affect the score?     Unable to reach patient by phone by LM reminding him of follow up appointment tomorrow to recheck blood pressure.   Cherre Robins, PharmD Clinical Pharmacist [facility]

## 2022-12-20 ENCOUNTER — Ambulatory Visit (INDEPENDENT_AMBULATORY_CARE_PROVIDER_SITE_OTHER): Payer: Managed Care, Other (non HMO) | Admitting: Family Medicine

## 2022-12-20 ENCOUNTER — Encounter: Payer: Self-pay | Admitting: Family Medicine

## 2022-12-20 VITALS — BP 132/80 | HR 56 | Temp 98.1°F | Ht 69.0 in | Wt 171.0 lb

## 2022-12-20 DIAGNOSIS — Z0000 Encounter for general adult medical examination without abnormal findings: Secondary | ICD-10-CM

## 2022-12-20 DIAGNOSIS — Z114 Encounter for screening for human immunodeficiency virus [HIV]: Secondary | ICD-10-CM | POA: Diagnosis not present

## 2022-12-20 LAB — COMPREHENSIVE METABOLIC PANEL
ALT: 25 U/L (ref 0–53)
AST: 25 U/L (ref 0–37)
Albumin: 4.5 g/dL (ref 3.5–5.2)
Alkaline Phosphatase: 56 U/L (ref 39–117)
BUN: 15 mg/dL (ref 6–23)
CO2: 27 mEq/L (ref 19–32)
Calcium: 9.2 mg/dL (ref 8.4–10.5)
Chloride: 103 mEq/L (ref 96–112)
Creatinine, Ser: 1.18 mg/dL (ref 0.40–1.50)
GFR: 64.63 mL/min (ref >60.00)
Glucose, Bld: 81 mg/dL (ref 70–99)
Potassium: 4.3 mEq/L (ref 3.5–5.1)
Sodium: 136 mEq/L (ref 135–145)
Total Bilirubin: 1.2 mg/dL (ref 0.2–1.2)
Total Protein: 7.2 g/dL (ref 6.0–8.3)

## 2022-12-20 LAB — CBC
HCT: 44.5 % (ref 39.0–52.0)
Hemoglobin: 14.9 g/dL (ref 13.0–17.0)
MCHC: 33.4 g/dL (ref 30.0–36.0)
MCV: 91.7 fl (ref 78.0–100.0)
Platelets: 137 10*3/uL — ABNORMAL LOW (ref 150.0–400.0)
RBC: 4.86 Mil/uL (ref 4.22–5.81)
RDW: 14.1 % (ref 11.5–15.5)
WBC: 3.1 10*3/uL — ABNORMAL LOW (ref 4.0–10.5)

## 2022-12-20 LAB — LIPID PANEL
Cholesterol: 170 mg/dL (ref 0–200)
HDL: 49.8 mg/dL (ref >39.00)
LDL Cholesterol: 112 mg/dL — ABNORMAL HIGH (ref 0–99)
NonHDL: 120.36
Total CHOL/HDL Ratio: 3
Triglycerides: 43 mg/dL (ref 0.0–149.0)
VLDL: 8.6 mg/dL (ref 0.0–40.0)

## 2022-12-20 NOTE — Patient Instructions (Addendum)
Give [place] 2-3 business days to get the results of your labs back.   Keep the diet clean and stay active.  Please get me a copy of your advanced directive form at your convenience.   Try to drink 55-60 oz of water daily outside of exercise.  For the muscle cramping, drink lots of fluids. Also take a spoonful of pickle juice nightly. An alternative would be a teaspoon of mustard, but most people prefer pickle juice.   Let us know if you need anything.

## 2022-12-20 NOTE — Progress Notes (Signed)
Chief Complaint  Patient presents with   Annual Exam    Leg cramps at night    Well Male Joel Young is here for a complete physical.   His last physical was >1 year ago.  Current diet: in general, a "healthy" diet.   Current exercise: walking Weight trend: stable Fatigue out of ordinary? No. Seat belt? Yes.   Advanced directive? No  Health maintenance Shingrix- Yes Colonoscopy- Yes Tetanus- Yes Hep C- Yes Pneumonia vaccine- Yes  Past Medical History:  Diagnosis Date   Elevated PSA    GERD (gastroesophageal reflux disease)    High cholesterol    Hypertension      Past Surgical History:  Procedure Laterality Date   APPENDECTOMY     BACK SURGERY     two back surgeries to help with sciatica    Medications  Current Outpatient Medications on File Prior to Visit  Medication Sig Dispense Refill   amLODipine (NORVASC) 5 MG tablet Take 1 tablet (5 mg total) by mouth daily. 30 tablet 3   baclofen (LIORESAL) 10 MG tablet Take 1 tablet (10 mg total) by mouth 2 (two) times daily. 60 each 0   chlorproMAZINE (THORAZINE) 10 MG tablet Take 1 tablet (10 mg total) by mouth 3 (three) times daily as needed for up to 5 days for hiccoughs. 15 tablet 0   cholecalciferol (VITAMIN D) 1000 units tablet Take 1,000 Units by mouth daily.     finasteride (PROSCAR) 5 MG tablet      metoprolol succinate (TOPROL-XL) 50 MG 24 hr tablet Take 1/2 tab in the AM, 1 tab in the PM.     potassium chloride (KLOR-CON M) 10 MEQ tablet Take 1 tablet (10 mEq total) by mouth daily. 90 tablet 3   rosuvastatin (CRESTOR) 5 MG tablet      triamterene-hydrochlorothiazide (MAXZIDE-25) 37.5-25 MG tablet Take 0.5 tablets by mouth daily. 45 tablet 3    Allergies Allergies  Allergen Reactions   Caffeine Other (See Comments)    tremors   Ciprofloxacin    Famotidine     Causes heart to race   Keflex [Cephalexin] Hives    Family History Family History  Problem Relation Age of Onset   Prostate cancer Father 29     Review of Systems: Constitutional:  no fevers Eye:  no recent significant change in vision Ears:  No changes in hearing Nose/Mouth/Throat:  no complaints of nasal congestion, no sore throat Cardiovascular: no chest pain Respiratory:  No shortness of breath Gastrointestinal:  No change in bowel habits GU:  No frequency Integumentary:  no abnormal skin lesions reported Neurologic:  no headaches Endocrine:  denies unexplained weight changes  Exam BP 132/80 (BP Location: Right Arm, Patient Position: Sitting, Cuff Size: Normal)   Pulse (!) 56   Temp 98.1 F (36.7 C) (Oral)   Ht 5\' 9"  (1.753 m)   Wt 171 lb (77.6 kg)   SpO2 99%   BMI 25.25 kg/m  General:  well developed, well nourished, in no apparent distress Skin:  no significant moles, warts, or growths Head:  no masses, lesions, or tenderness Eyes:  pupils equal and round, sclera anicteric without injection Ears:  canals without lesions, TMs shiny without retraction, no obvious effusion, no erythema Nose:  nares patent, mucosa normal Throat/Pharynx:  lips and gingiva without lesion; tongue and uvula midline; non-inflamed pharynx; no exudates or postnasal drainage Lungs:  clear to auscultation, breath sounds equal bilaterally, no respiratory distress Cardio:  regular rhythm, bradycardic, no  LE edema or bruits Rectal: Deferred GI: BS+, S, NT, ND, no masses or organomegaly Musculoskeletal:  symmetrical muscle groups noted without atrophy or deformity Neuro:  gait normal; deep tendon reflexes normal and symmetric Psych: well oriented with normal range of affect and appropriate judgment/insight  Assessment and Plan  Well adult exam - Plan: CBC, Comprehensive metabolic panel, Lipid panel  Screening for HIV without presence of risk factors - Plan: HIV Antibody (routine testing w rflx)   Well 67 y.o. male. Counseled on diet and exercise. Needs to drink more water.  Consider pickle juice for cramping. Follows w urology for  prostate.  Other orders as above. Advanced directive form provided today.  Follow up in 6 mo.  The patient voiced understanding and agreement to the plan.  [place], DO 12/20/22 10:54 AM

## 2022-12-21 ENCOUNTER — Encounter: Payer: Self-pay | Admitting: Family Medicine

## 2022-12-21 LAB — HIV ANTIBODY (ROUTINE TESTING W REFLEX): HIV 1&2 Ab, 4th Generation: NONREACTIVE

## 2023-02-07 ENCOUNTER — Encounter: Payer: Self-pay | Admitting: Family Medicine

## 2023-02-08 ENCOUNTER — Ambulatory Visit (HOSPITAL_BASED_OUTPATIENT_CLINIC_OR_DEPARTMENT_OTHER)
Admission: RE | Admit: 2023-02-08 | Discharge: 2023-02-08 | Disposition: A | Discharge status: Home / Self Care | Payer: Managed Care, Other (non HMO) | Source: Ambulatory Visit | Attending: Family Medicine | Admitting: Family Medicine

## 2023-02-08 ENCOUNTER — Ambulatory Visit: Payer: Managed Care, Other (non HMO) | Admitting: Family Medicine

## 2023-02-08 ENCOUNTER — Encounter: Payer: Self-pay | Admitting: Family Medicine

## 2023-02-08 VITALS — BP 134/71 | HR 60 | Temp 97.7°F | Resp 18 | Ht 69.0 in | Wt 174.2 lb

## 2023-02-08 DIAGNOSIS — R202 Paresthesia of skin: Secondary | ICD-10-CM

## 2023-02-08 NOTE — Progress Notes (Signed)
Acute Office Visit  Subjective:     Patient ID: Joel Young, male    DOB: [birth year], 67 y.o.   MRN: [id number]  Chief Complaint  Patient presents with   Numbness    And weakness in the fingertips x 1-2 months. L worse than R. No wrist pain, no neck pain.     HPI Patient is in today for hand numbness/tingling.   Patient states that for the past 2 months he has been noticing some intermittent numbness/tingling to both of his hands. Reports that symptoms are typically in the morning - he wakes up, grabs his phone and starts checking message. Within 2-3 minutes, his fingers will start feeling tingly and numb. He will pass phone to opposite hand and same symptoms will occur after several minutes. States elbows are flexed during this time. Symptoms tend to be worse in left 3rd and 4th fingers, but also occur in right fingers as well. He has also noticed that if he is holding a very cold drink, within a few minutes, his right ring finger will look white and feel cold/numb. He will put the cold object down and hand will return to normal within a few minutes. States symptoms occur the same way almost every day, but never occur when he his active and moving about. He denies any hand or neck pain. Symptoms always resolve within a few minutes of stretching and moving around. No weakness apart from when the fingers feel numb/tingly, then his grip will feel slightly weak.    ROS All review of systems negative except what is listed in the HPI      Objective:    BP 134/71   Pulse 60   Temp 97.7 F (36.5 C) (Oral)   Resp 18   Ht 5\' 9"  (1.753 m)   Wt 174 lb 3.2 oz (79 kg)   SpO2 100%   BMI 25.72 kg/m    Physical Exam Vitals reviewed.  Constitutional:      General: He is not in acute distress.    Appearance: Normal appearance. He is normal weight. He is not ill-appearing.  HENT:     Head: Normocephalic and atraumatic.  Cardiovascular:     Rate and Rhythm: Normal rate and regular  rhythm.     Pulses: Normal pulses.  Pulmonary:     Effort: Pulmonary effort is normal.     Breath sounds: Normal breath sounds.  Musculoskeletal:        General: No swelling or tenderness. Normal range of motion.     Cervical back: Normal range of motion and neck supple. No tenderness.     Comments: Negative Tinels/Phalens, full ROM of bilateral upper extremities and neck  Lymphadenopathy:     Cervical: No cervical adenopathy.  Skin:    General: Skin is warm.     Findings: No bruising or erythema.  Neurological:     Mental Status: He is alert and oriented to person, place, and time.  Psychiatric:        Mood and Affect: Mood normal.        Behavior: Behavior normal.        Thought Content: Thought content normal.     No results found for any visits on 02/08/23.      Assessment & Plan:   Problem List Items Addressed This Visit   None Visit Diagnoses     Paresthesia of both hands    -  Primary   Relevant Orders   DG Cervical  Spine Complete     Cervical spine xray today Discussed avoiding positions that cause the symptoms, stretching frequently, staying active, avoid holding cold objects with bare hands. No other concerning symptoms at this time. Will let you know xray results. Follow-up with PCP if symptoms persist.   No orders of the defined types were placed in this encounter.   Return in about 2 weeks (around 02/22/2023), or if symptoms worsen or fail to improve, for PCP follow-up if symtpoms persist .  Terrilyn Saver, NP

## 2023-02-08 NOTE — Patient Instructions (Addendum)
Cervical spine xray today Discussed avoiding positions that cause the symptoms, stretching frequently, staying active, avoid holding cold objects with bare hands. No other concerning symptoms at this time. Will let you know xray results. Follow-up with PCP if symptoms persist.

## 2023-02-11 ENCOUNTER — Other Ambulatory Visit: Payer: Self-pay | Admitting: Family Medicine

## 2023-02-11 DIAGNOSIS — I10 Essential (primary) hypertension: Secondary | ICD-10-CM

## 2023-02-28 ENCOUNTER — Ambulatory Visit: Payer: Managed Care, Other (non HMO) | Admitting: Family Medicine

## 2023-02-28 ENCOUNTER — Encounter: Payer: Self-pay | Admitting: Family Medicine

## 2023-02-28 VITALS — BP 112/68 | HR 70 | Temp 97.9°F | Ht 69.0 in | Wt 166.1 lb

## 2023-02-28 DIAGNOSIS — G5603 Carpal tunnel syndrome, bilateral upper limbs: Secondary | ICD-10-CM | POA: Diagnosis not present

## 2023-02-28 DIAGNOSIS — I7300 Raynaud's syndrome without gangrene: Secondary | ICD-10-CM

## 2023-02-28 MED ORDER — FINASTERIDE 5 MG PO TABS: 5.0000 mg | ORAL_TABLET | Freq: Every day | ORAL | 3 refills | Status: AC

## 2023-02-28 MED ORDER — NIFEDIPINE ER OSMOTIC RELEASE 30 MG PO TB24: 30.0000 mg | ORAL_TABLET | Freq: Every day | ORAL | 1 refills | Status: DC

## 2023-02-28 MED ORDER — ROSUVASTATIN CALCIUM 5 MG PO TABS: 5.0000 mg | ORAL_TABLET | Freq: Every day | ORAL | 3 refills | Status: AC

## 2023-02-28 NOTE — Patient Instructions (Signed)
Consider wearing wrist braces on both sides during sleep and aggravating activities.   Let us know if you need anything.

## 2023-02-28 NOTE — Progress Notes (Signed)
Chief Complaint  Patient presents with   Follow-up    Subjective: Patient is a 67 y.o. male here for f/u.  For around 3 months, the patient has been having a weakness/discomfort in his middle and ring fingers of both hands, worse on the left.  It is exacerbated by touching something cold.  Also when he uses his phone.  He has some tingling in his thumb and index finger.  Happens daily.  Not associate with activity.  Does not wake him up in the middle the night.  He says his fingers will turn a light color when it happens.  Past Medical History:  Diagnosis Date   Elevated PSA    GERD (gastroesophageal reflux disease)    High cholesterol    Hypertension     Objective: BP 112/68 (BP Location: Left Arm, Patient Position: Sitting, Cuff Size: Normal)   Pulse 70   Temp 97.9 F (36.6 C) (Oral)   Ht  (1.753 m)   Wt 166 lb 2 oz (75.4 kg)   SpO2 98%   BMI 24.53 kg/m  General: Awake, appears stated age Skin: No discoloration or excessive warmth Neuro: Negative Tinel's, positive Phalen's bilaterally, grip strength adequate b/l MSK: No ttp or atrophy Lungs: No accessory muscle use Psych: Age appropriate judgment and insight, normal affect and mood  Assessment and Plan: Raynaud's phenomenon without gangrene - Plan: NIFEdipine (PROCARDIA-XL/NIFEDICAL-XL) 30 MG 24 hr tablet  Bilateral carpal tunnel syndrome  Chronic, uncontrolled. Start Nifedipine XL 30 mg/d. F/u in 1 mo to reck. Try to avoid cold things in meanwhile. No signs of ulcerations/wounds. Mild. Could use wrist braces at night and/or during aggravating activities.  The patient voiced understanding and agreement to the plan.  Jilda Roche [place], DO 02/28/23  11:58 AM

## 2023-03-23 ENCOUNTER — Other Ambulatory Visit: Payer: Self-pay | Admitting: Family Medicine

## 2023-03-23 DIAGNOSIS — I7300 Raynaud's syndrome without gangrene: Secondary | ICD-10-CM

## 2023-03-27 ENCOUNTER — Emergency Department (HOSPITAL_BASED_OUTPATIENT_CLINIC_OR_DEPARTMENT_OTHER)
Admission: EM | Admit: 2023-03-27 | Discharge: 2023-03-27 | Disposition: A | Discharge status: Home / Self Care | Payer: Managed Care, Other (non HMO) | Attending: Emergency Medicine | Admitting: Emergency Medicine

## 2023-03-27 ENCOUNTER — Other Ambulatory Visit: Payer: Self-pay

## 2023-03-27 ENCOUNTER — Emergency Department (HOSPITAL_BASED_OUTPATIENT_CLINIC_OR_DEPARTMENT_OTHER): Payer: Managed Care, Other (non HMO)

## 2023-03-27 DIAGNOSIS — K219 Gastro-esophageal reflux disease without esophagitis: Secondary | ICD-10-CM | POA: Diagnosis present

## 2023-03-27 LAB — BASIC METABOLIC PANEL
Anion gap: 9 (ref 5–15)
BUN: 15 mg/dL (ref 8–23)
CO2: 24 mmol/L (ref 22–32)
Calcium: 8.9 mg/dL (ref 8.9–10.3)
Chloride: 101 mmol/L (ref 98–111)
Creatinine, Ser: 1.19 mg/dL (ref 0.61–1.24)
GFR, Estimated: >60 mL/min (ref >60)
Glucose, Bld: 92 mg/dL (ref 70–99)
Potassium: 4.4 mmol/L (ref 3.5–5.1)
Sodium: 134 mmol/L — ABNORMAL LOW (ref 135–145)

## 2023-03-27 LAB — CBC
HCT: 43.9 % (ref 39.0–52.0)
Hemoglobin: 14.9 g/dL (ref 13.0–17.0)
MCH: 30.5 pg (ref 26.0–34.0)
MCHC: 33.9 g/dL (ref 30.0–36.0)
MCV: 90 fL (ref 80.0–100.0)
Platelets: 142 10*3/uL — ABNORMAL LOW (ref 150–400)
RBC: 4.88 MIL/uL (ref 4.22–5.81)
RDW: 14.1 % (ref 11.5–15.5)
WBC: 2.7 10*3/uL — ABNORMAL LOW (ref 4.0–10.5)
nRBC: 0 % (ref 0.0–0.2)

## 2023-03-27 LAB — TROPONIN I (HIGH SENSITIVITY): Troponin I (High Sensitivity): 4 ng/L (ref <18)

## 2023-03-27 MED ORDER — PANTOPRAZOLE SODIUM 40 MG IV SOLR
40.0000 mg | Freq: Once | INTRAVENOUS | Status: AC
  Administered 2023-03-27: 40 mg via INTRAVENOUS
  Filled 2023-03-27: qty 10

## 2023-03-27 MED ORDER — ALUM & MAG HYDROXIDE-SIMETH 200-200-20 MG/5ML PO SUSP
30.0000 mL | Freq: Once | ORAL | Status: AC
  Administered 2023-03-27: 30 mL via ORAL
  Filled 2023-03-27: qty 30

## 2023-03-27 NOTE — ED Triage Notes (Addendum)
Patient presents to ED via POV from home. Here with "acid reflux" x 5 days. Endorses nausea. Belching in triage.

## 2023-03-27 NOTE — ED Notes (Signed)
Pt verbalized understanding of discharge instructions. Opportunity for questions provided.  

## 2023-03-27 NOTE — Discharge Instructions (Signed)
It was a pleasure taking care of you today!   Your labs didn't show any emergent findings today. Attached is the information for the on-call GI specialist, call and set up a follow appointment regarding todays ED visit. Continue with your prescription for Pantoprozole, take as directed. Ensure to maintain fluid intake. Follow-up with your primary care provider regarding today's ED visit.  Return to the emergency department if you experience increasing/worsening symptoms.

## 2023-03-27 NOTE — ED Provider Notes (Signed)
[facility] Provider Note   CSN: [id number] Arrival date & time: 03/27/23  4098     History  Chief Complaint  Patient presents with   Gastroesophageal Reflux    Joel Young is a 67 y.o. male with a past medical history of GERD who presents emergency department concerns for GERD-like episodes x 4-5 days.  Notes similar symptoms in the past.  Patient notes that he takes pantoprazole however does not take it daily.  Denies exacerbating factors for the onset of his symptoms.  Does not have a GI doctor that he sees.  Denies chest pain, shortness of breath, abdominal pain, nausea, vomiting, diaphoresis.    The history is provided by the patient. No language interpreter was used.       Home Medications Prior to Admission medications   Medication Sig Start Date End Date Taking? Authorizing Provider  chlorproMAZINE (THORAZINE) 10 MG tablet Take 1 tablet (10 mg total) by mouth 3 (three) times daily as needed for up to 5 days for hiccoughs. 06/09/21 06/14/21  Melene Plan, DO  cholecalciferol (VITAMIN D) 1000 units tablet Take 1,000 Units by mouth daily.    [provider]  finasteride (PROSCAR) 5 MG tablet Take 1 tablet (5 mg total) by mouth daily. 02/28/23   Sharlene Dory, DO  metoprolol succinate (TOPROL-XL) 50 MG 24 hr tablet Take 1/2 tab in the AM, 1 tab in the PM. 11/15/22   Carmelia Roller, Jilda Roche, DO  NIFEdipine (PROCARDIA-XL/NIFEDICAL-XL) 30 MG 24 hr tablet TAKE 1 TABLET BY MOUTH EVERY DAY 03/23/23   Wendling, Jilda Roche, DO  potassium chloride (KLOR-CON M) 10 MEQ tablet Take 1 tablet (10 mEq total) by mouth daily. 11/15/22   Sharlene Dory, DO  rosuvastatin (CRESTOR) 5 MG tablet Take 1 tablet (5 mg total) by mouth daily. 02/28/23   Sharlene Dory, DO  triamterene-hydrochlorothiazide (MAXZIDE-25) 37.5-25 MG tablet Take 0.5 tablets by mouth daily. 11/15/22   Sharlene Dory, DO      Allergies     Caffeine, Ciprofloxacin, Famotidine, and Keflex [cephalexin]    Review of Systems   Review of Systems  All other systems reviewed and are negative.   Physical Exam Updated Vital Signs BP (!) 134/90 (BP Location: Left Arm)   Pulse 67   Temp 98 F (36.7 C) (Oral)   Resp 17   Ht 5\' 9"  (1.753 m)   Wt 74.8 kg   SpO2 100%   BMI 24.37 kg/m  Physical Exam Vitals and nursing note reviewed.  Constitutional:      General: He is not in acute distress.    Appearance: He is not diaphoretic.  HENT:     Head: Normocephalic and atraumatic.     Mouth/Throat:     Pharynx: No oropharyngeal exudate.  Eyes:     General: No scleral icterus.    Conjunctiva/sclera: Conjunctivae normal.  Cardiovascular:     Rate and Rhythm: Normal rate and regular rhythm.     Pulses: Normal pulses.     Heart sounds: Normal heart sounds.  Pulmonary:     Effort: Pulmonary effort is normal. No respiratory distress.     Breath sounds: Normal breath sounds. No wheezing.  Chest:     Chest wall: No tenderness.     Comments: No chest wall TTP Abdominal:     General: Bowel sounds are normal.     Palpations: Abdomen is soft. There is no mass.     Tenderness: There  is no abdominal tenderness. There is no guarding or rebound.     Comments: No abdominal TTP  Musculoskeletal:        General: Normal range of motion.     Cervical back: Normal range of motion and neck supple.  Skin:    General: Skin is warm and dry.  Neurological:     Mental Status: He is alert.  Psychiatric:        Behavior: Behavior normal.     ED Results / Procedures / Treatments   Labs (all labs ordered are listed, but only abnormal results are displayed) Labs Reviewed  BASIC METABOLIC PANEL - Abnormal; Notable for the following components:      Result Value   Sodium 134 (*)    All other components within normal limits  CBC - Abnormal; Notable for the following components:   WBC 2.7 (*)    Platelets 142 (*)    All other components  within normal limits  TROPONIN I (HIGH SENSITIVITY)    EKG EKG Interpretation  Date/Time:  Tuesday Mar 27 2023 09:20:44 EDT Ventricular Rate:  90 PR Interval:  173 QRS Duration: 92 QT Interval:  380 QTC Calculation: 465 R Axis:   -9 Text Interpretation: Sinus rhythm Probable left ventricular hypertrophy Anterior Q waves, possibly due to LVH Confirmed by Coralee Pesa [phone]) on 03/27/2023 9:43:39 AM  Radiology DG Chest Port 1 View  Result Date: 03/27/2023 CLINICAL DATA:  GERD EXAM: PORTABLE CHEST 1 VIEW COMPARISON:  CXR 06/09/21 FINDINGS: No pleural effusion. No pneumothorax. No focal airspace opacity. Normal cardiac and mediastinal contours. Possible hiatal or a diaphragmatic hernia. No radiographically apparent displaced rib fractures. Visualized upper abdomen is unremarkable. IMPRESSION: 1.  No focal airspace opacity. 2.  Possible hiatal or a diaphragmatic hernia. Electronically Signed   By: Lorenza Cambridge M.D.   On: 03/27/2023 10:00    Procedures Procedures    Medications Ordered in ED Medications  alum & mag hydroxide-simeth (MAALOX/MYLANTA) 200-200-20 MG/5ML suspension 30 mL (30 mLs Oral Given 03/27/23 0953)  pantoprazole (PROTONIX) injection 40 mg (40 mg Intravenous Given 03/27/23 9604)    ED Course/ Medical Decision Making/ A&P Clinical Course as of 03/27/23 1800  Tue Mar 27, 2023  1119 Pt re-evaluated and resting comfortably on stretcher. Discussed lab and imaging findings. Discussed discharge treatment plan. Answered all available questions. Pt appears safe for discharge. [SB]    Clinical Course User Index [SB] Rowene Suto A, PA-C                             Medical Decision Making Amount and/or Complexity of Data Reviewed Labs: ordered. Radiology: ordered.  Risk OTC drugs. Prescription drug management.   Pt presents with concern for GERD like symptoms x 4-5 days.  Has a history of GERD and notes intermittent use of his pantoprazole.  Patient afebrile.  On  exam patient without acute cardiovascular, respiratory, down exam findings.  Differential diagnosis includes GERD, ACS, hernia, ulcer.  Co morbidities that complicate the patient evaluation: Hypertension, elevated cholesterol, GERD   Labs:  I ordered, and personally interpreted labs.  The pertinent results include:   Initial troponin 4 CBC unremarkable BMP unremarkable  Imaging: I ordered imaging studies including Chest x-ray I independently visualized and interpreted imaging which showed:  1.  No focal airspace opacity.    2.  Possible hiatal or a diaphragmatic hernia.   I agree with the radiologist interpretation  Medications:  I ordered medication including Protonix, GI cocktail for symptom management Reevaluation of the patient after these medicines and interventions, I reevaluated the patient and found that they have improved I have reviewed the patients home medicines and have made adjustments as needed  Disposition: Presentation suspicious for likely GERD exacerbation.  Incidental finding of suspected hiatal hernia on chest x-ray.  Doubt concerns at this time for ACS or ulcer. After consideration of the diagnostic results and the patients response to treatment, I feel that the patient would benefit from Discharge home.  Patient instructed to continue to take his PPI as prescribed to him.  Information for GI specialist given to patient today.  Supportive care measures and strict return precautions discussed with patient at bedside. Pt acknowledges and verbalizes understanding. Pt appears safe for discharge. Follow up as indicated in discharge paperwork.    This chart was dictated using voice recognition software, Dragon. Despite the best efforts of this provider to proofread and correct errors, errors may still occur which can change documentation meaning.   Final Clinical Impression(s) / ED Diagnoses Final diagnoses:  Gastroesophageal reflux disease without esophagitis     Rx / DC Orders ED Discharge Orders     None         Travers Goodley A, PA-C 03/27/23 1801    Rozelle Logan, DO 03/28/23 0981

## 2023-03-28 ENCOUNTER — Ambulatory Visit: Payer: Managed Care, Other (non HMO) | Admitting: Family Medicine

## 2023-04-05 ENCOUNTER — Other Ambulatory Visit: Payer: Self-pay | Admitting: Family Medicine

## 2023-04-05 ENCOUNTER — Ambulatory Visit (INDEPENDENT_AMBULATORY_CARE_PROVIDER_SITE_OTHER): Payer: Managed Care, Other (non HMO) | Admitting: Family Medicine

## 2023-04-05 ENCOUNTER — Encounter: Payer: Self-pay | Admitting: Family Medicine

## 2023-04-05 VITALS — BP 118/72 | HR 60 | Temp 97.6°F | Ht 69.0 in | Wt 160.0 lb

## 2023-04-05 DIAGNOSIS — R634 Abnormal weight loss: Secondary | ICD-10-CM | POA: Diagnosis not present

## 2023-04-05 LAB — COMPREHENSIVE METABOLIC PANEL
ALT: 11 U/L (ref 0–53)
AST: 17 U/L (ref 0–37)
Albumin: 4.2 g/dL (ref 3.5–5.2)
Alkaline Phosphatase: 51 U/L (ref 39–117)
BUN: 21 mg/dL (ref 6–23)
CO2: 27 mEq/L (ref 19–32)
Calcium: 9.4 mg/dL (ref 8.4–10.5)
Chloride: 101 mEq/L (ref 96–112)
Creatinine, Ser: 1.34 mg/dL (ref 0.40–1.50)
GFR: 55.37 mL/min — ABNORMAL LOW (ref >60.00)
Glucose, Bld: 83 mg/dL (ref 70–99)
Potassium: 4.3 mEq/L (ref 3.5–5.1)
Sodium: 136 mEq/L (ref 135–145)
Total Bilirubin: 1.2 mg/dL (ref 0.2–1.2)
Total Protein: 6.9 g/dL (ref 6.0–8.3)

## 2023-04-05 LAB — TSH: TSH: 1.13 u[IU]/mL (ref 0.35–5.50)

## 2023-04-05 LAB — HEMOGLOBIN A1C: Hgb A1c MFr Bld: 5.7 % (ref 4.6–6.5)

## 2023-04-05 MED ORDER — PANTOPRAZOLE SODIUM 40 MG PO TBEC
40.0000 mg | DELAYED_RELEASE_TABLET | Freq: Two times a day (BID) | ORAL | 2 refills | Status: DC
Start: 2023-04-05 — End: 2023-04-17

## 2023-04-05 MED ORDER — MELOXICAM 15 MG PO TABS
15.0000 mg | ORAL_TABLET | Freq: Every day | ORAL | 0 refills | Status: DC
Start: 2023-04-05 — End: 2023-09-06

## 2023-04-05 MED ORDER — METOPROLOL SUCCINATE ER 50 MG PO TB24: ORAL_TABLET | ORAL | 2 refills | Status: DC

## 2023-04-05 NOTE — Progress Notes (Signed)
Chief Complaint  Patient presents with   Weight Loss    Subjective: Patient is a 67 y.o. male here for wt loss.  Has lost around 20 lbs in the past 4-5 mo. Has appt with Dr. Dulce Sellar of [facility] next mo. He will eat thing and 2-3 days later he will regurgitate it. No pain, bleeding, diarrhea, nausea, coughing. Unsure which types of foods it happens with, does not happen with everything that he eats. Voice has not changed.   Past Medical History:  Diagnosis Date   Elevated PSA    GERD (gastroesophageal reflux disease)    High cholesterol    Hypertension     Objective: BP 118/72 (BP Location: Right Arm, Patient Position: Sitting, Cuff Size: Normal)   Pulse 60   Temp 97.6 F (36.4 C) (Oral)   Ht 5\' 9"  (1.753 m)   Wt 160 lb (72.6 kg)   SpO2 99%   BMI 23.63 kg/m  General: Awake, appears stated age Heart: RRR, no LE edema Lungs: CTAB, no rales, wheezes or rhonchi. No accessory muscle use Abd: BS+, S, NT, ND Mouth: MMM Neck: No ttp, asymmetry, masses noted Psych: Age appropriate judgment and insight, normal affect and mood  Assessment and Plan: Unintentional weight loss of 10% body weight within 6 months - Plan: Comprehensive metabolic panel, HIV Antibody (routine testing w rflx), TSH, Hemoglobin A1c  Appropriately has appt w GI within a month. Ck above labs. Try to find out what triggers his s/s's. Hx of low back pain. Flared recently, requesting refill of Mobic. Sent to take qd prn. Usually needs for 2-3 d at a time.  The patient voiced understanding and agreement to the plan.  Jilda Roche [place], DO 04/05/23  9:02 AM

## 2023-04-05 NOTE — Patient Instructions (Addendum)
Give Joel Young 2-3 business days to get the results of your labs back.   Keep the diet clean and stay active.  Keep your appointment with Dr. Dulce Sellar please.  Keep trying to find out what foods/drinks flare your symptoms.   Let Joel Young know if you need anything.

## 2023-04-07 LAB — HIV ANTIBODY (ROUTINE TESTING W REFLEX): HIV 1&2 Ab, 4th Generation: NONREACTIVE

## 2023-04-17 ENCOUNTER — Other Ambulatory Visit: Payer: Self-pay

## 2023-04-17 ENCOUNTER — Emergency Department (HOSPITAL_BASED_OUTPATIENT_CLINIC_OR_DEPARTMENT_OTHER)
Admission: EM | Admit: 2023-04-17 | Discharge: 2023-04-17 | Disposition: A | Discharge status: Home / Self Care | Payer: Managed Care, Other (non HMO) | Attending: Emergency Medicine | Admitting: Emergency Medicine

## 2023-04-17 ENCOUNTER — Encounter (HOSPITAL_BASED_OUTPATIENT_CLINIC_OR_DEPARTMENT_OTHER): Payer: Self-pay | Admitting: Emergency Medicine

## 2023-04-17 ENCOUNTER — Other Ambulatory Visit (HOSPITAL_BASED_OUTPATIENT_CLINIC_OR_DEPARTMENT_OTHER): Payer: Self-pay

## 2023-04-17 ENCOUNTER — Emergency Department (HOSPITAL_BASED_OUTPATIENT_CLINIC_OR_DEPARTMENT_OTHER): Payer: Managed Care, Other (non HMO)

## 2023-04-17 DIAGNOSIS — K2100 Gastro-esophageal reflux disease with esophagitis, without bleeding: Secondary | ICD-10-CM | POA: Insufficient documentation

## 2023-04-17 DIAGNOSIS — K219 Gastro-esophageal reflux disease without esophagitis: Secondary | ICD-10-CM | POA: Diagnosis present

## 2023-04-17 DIAGNOSIS — I10 Essential (primary) hypertension: Secondary | ICD-10-CM | POA: Diagnosis not present

## 2023-04-17 DIAGNOSIS — Z79899 Other long term (current) drug therapy: Secondary | ICD-10-CM | POA: Diagnosis not present

## 2023-04-17 DIAGNOSIS — M542 Cervicalgia: Secondary | ICD-10-CM | POA: Diagnosis not present

## 2023-04-17 LAB — BASIC METABOLIC PANEL
Anion gap: 9 (ref 5–15)
BUN: 19 mg/dL (ref 8–23)
CO2: 23 mmol/L (ref 22–32)
Calcium: 9.1 mg/dL (ref 8.9–10.3)
Chloride: 100 mmol/L (ref 98–111)
Creatinine, Ser: 1.27 mg/dL — ABNORMAL HIGH (ref 0.61–1.24)
GFR, Estimated: >60 mL/min (ref >60)
Glucose, Bld: 106 mg/dL — ABNORMAL HIGH (ref 70–99)
Potassium: 3.5 mmol/L (ref 3.5–5.1)
Sodium: 132 mmol/L — ABNORMAL LOW (ref 135–145)

## 2023-04-17 LAB — CBC
HCT: 43.4 % (ref 39.0–52.0)
Hemoglobin: 14.8 g/dL (ref 13.0–17.0)
MCH: 30.3 pg (ref 26.0–34.0)
MCHC: 34.1 g/dL (ref 30.0–36.0)
MCV: 88.8 fL (ref 80.0–100.0)
Platelets: 168 10*3/uL (ref 150–400)
RBC: 4.89 MIL/uL (ref 4.22–5.81)
RDW: 13.6 % (ref 11.5–15.5)
WBC: 5.2 10*3/uL (ref 4.0–10.5)
nRBC: 0 % (ref 0.0–0.2)

## 2023-04-17 LAB — TROPONIN I (HIGH SENSITIVITY): Troponin I (High Sensitivity): 5 ng/L (ref <18)

## 2023-04-17 MED ORDER — PANTOPRAZOLE SODIUM 40 MG PO TBEC: 40.0000 mg | DELAYED_RELEASE_TABLET | Freq: Two times a day (BID) | ORAL | 2 refills | Status: DC

## 2023-04-17 MED ORDER — LIDOCAINE VISCOUS HCL 2 % MT SOLN
15.0000 mL | Freq: Once | OROMUCOSAL | Status: AC
  Administered 2023-04-17: 15 mL via ORAL
  Filled 2023-04-17: qty 15

## 2023-04-17 MED ORDER — ALUM & MAG HYDROXIDE-SIMETH 200-200-20 MG/5ML PO SUSP
30.0000 mL | Freq: Once | ORAL | Status: AC
  Administered 2023-04-17: 30 mL via ORAL
  Filled 2023-04-17: qty 30

## 2023-04-17 MED ORDER — PANTOPRAZOLE SODIUM 40 MG PO TBEC
40.0000 mg | DELAYED_RELEASE_TABLET | Freq: Two times a day (BID) | ORAL | 2 refills | Status: AC
  Filled 2023-04-17: qty 60, 30d supply, fill #0

## 2023-04-17 NOTE — ED Notes (Signed)
Discharge instructions reviewed with patient. Patient verbalizes understanding, no further questions at this time. Medications/prescriptions and follow up information provided. No acute distress noted at time of departure.  

## 2023-04-17 NOTE — Discharge Instructions (Signed)
You were seen for your reflux in the emergency department.   At home, please take the Protonix twice a day that you are prescribed.    Check your MyChart online for the results of any tests that had not resulted by the time you left the emergency department.   Follow-up with your primary doctor in 2-3 days regarding your visit.  Follow-up with your GI doctor as scheduled.  Return immediately to the emergency department if you experience any of the following: Chest pain, shortness of breath, or any other concerning symptoms.    Thank you for visiting our Emergency Department. It was a pleasure taking care of you today.

## 2023-04-17 NOTE — ED Provider Notes (Signed)
[facility] Provider Note   CSN: [id number] Arrival date & time: 04/17/23  1021     History {Add pertinent medical, surgical, social history, OB history to HPI:1} Chief Complaint  Patient presents with   Gastroesophageal Reflux    Joel Young is a 67 y.o. male.  67 year old male with a history of GERD, hypertension, and hyperlipidemia who presents to the emergency department with neck discomfort.  Patient reports he has longstanding history of GERD.  Has been more frequent over the past year.  Today was driving to work at 9 AM and started having some neck discomfort that felt like his reflux.  Said that he tried bearing down which typically helps his symptoms but it did not.  Said that he started feeling short of breath and so called 911.  No chest pain, diaphoresis, or nausea or vomiting.  No personal family history of MI.  No personal history of smoking.  Was recently restarted on his Protonix but says that he has not been able to pick it up from the pharmacy.  Also was in the emergency department on 03/27/2023 with similar symptoms with reassuring EKG and troponin.       Home Medications Prior to Admission medications   Medication Sig Start Date End Date Taking? Authorizing Provider  cholecalciferol (VITAMIN D) 1000 units tablet Take 1,000 Units by mouth daily.    [provider]  finasteride (PROSCAR) 5 MG tablet Take 1 tablet (5 mg total) by mouth daily. 02/28/23   Sharlene Dory, DO  meloxicam (MOBIC) 15 MG tablet Take 1 tablet (15 mg total) by mouth daily. 04/05/23   Sharlene Dory, DO  metoprolol succinate (TOPROL-XL) 50 MG 24 hr tablet Take 1/2 tab in the AM, 1 tab in the PM. 04/05/23   Carmelia Roller, Jilda Roche, DO  NIFEdipine (PROCARDIA-XL/NIFEDICAL-XL) 30 MG 24 hr tablet TAKE 1 TABLET BY MOUTH EVERY DAY 03/23/23   Carmelia Roller, Jilda Roche, DO  pantoprazole (PROTONIX) 40 MG tablet Take 1 tablet (40 mg total)  by mouth 2 (two) times daily before a meal. 04/17/23   Rondel Baton, MD  potassium chloride (KLOR-CON M) 10 MEQ tablet Take 1 tablet (10 mEq total) by mouth daily. 11/15/22   Sharlene Dory, DO  rosuvastatin (CRESTOR) 5 MG tablet Take 1 tablet (5 mg total) by mouth daily. 02/28/23   Sharlene Dory, DO  triamterene-hydrochlorothiazide (MAXZIDE-25) 37.5-25 MG tablet Take 0.5 tablets by mouth daily. 11/15/22   Sharlene Dory, DO      Allergies    Caffeine, Ciprofloxacin, Famotidine, and Keflex [cephalexin]    Review of Systems   Review of Systems  Physical Exam Updated Vital Signs BP (!) 126/92 (BP Location: Left Arm)   Pulse 65   Resp 16   SpO2 100%  Physical Exam Vitals and nursing note reviewed.  Constitutional:      General: He is not in acute distress.    Appearance: He is well-developed.  HENT:     Head: Normocephalic and atraumatic.     Right Ear: External ear normal.     Left Ear: External ear normal.     Nose: Nose normal.  Eyes:     Extraocular Movements: Extraocular movements intact.     Conjunctiva/sclera: Conjunctivae normal.     Pupils: Pupils are equal, round, and reactive to light.  Cardiovascular:     Rate and Rhythm: Normal rate and regular rhythm.     Heart sounds: Normal  heart sounds.  Pulmonary:     Effort: Pulmonary effort is normal. No respiratory distress.     Breath sounds: Normal breath sounds.  Abdominal:     General: There is no distension.     Palpations: Abdomen is soft. There is no mass.     Tenderness: There is no abdominal tenderness. There is no guarding.  Musculoskeletal:     Cervical back: Normal range of motion and neck supple.  Skin:    General: Skin is warm and dry.  Neurological:     Mental Status: He is alert. Mental status is at baseline.  Psychiatric:        Mood and Affect: Mood normal.        Behavior: Behavior normal.     ED Results / Procedures / Treatments   Labs (all labs ordered are  listed, but only abnormal results are displayed) Labs Reviewed - No data to display  EKG None  Radiology No results found.  Procedures Procedures  {Document cardiac monitor, telemetry assessment procedure when appropriate:1}  Medications Ordered in ED Medications  alum & mag hydroxide-simeth (MAALOX/MYLANTA) 200-200-20 MG/5ML suspension 30 mL (has no administration in time range)    And  lidocaine (XYLOCAINE) 2 % viscous mouth solution 15 mL (has no administration in time range)    ED Course/ Medical Decision Making/ A&P   {   Click here for ABCD2, HEART and other calculatorsREFRESH Note before signing :1}                          Medical Decision Making Amount and/or Complexity of Data Reviewed Radiology: ordered.  Risk OTC drugs. Prescription drug management.   ***  {Document critical care time when appropriate:1} {Document review of labs and clinical decision tools ie heart score, Chads2Vasc2 etc:1}  {Document your independent review of radiology images, and any outside records:1} {Document your discussion with family members, caretakers, and with consultants:1} {Document social determinants of health affecting pt's care:1} {Document your decision making why or why not admission, treatments were needed:1} Final Clinical Impression(s) / ED Diagnoses Final diagnoses:  None    Rx / DC Orders ED Discharge Orders          Ordered    pantoprazole (PROTONIX) 40 MG tablet  2 times daily before meals        04/17/23 1101

## 2023-04-17 NOTE — ED Notes (Signed)
Pt is able to hold a conversation with out burping and then begins to burp after the conversation

## 2023-04-17 NOTE — ED Triage Notes (Signed)
Pt arrives via EMS for reflux states he had not taken his meds and he was flat when  they arrived and felt better sitting up has  been belching  and burping alot

## 2023-04-17 NOTE — ED Notes (Signed)
Pt. Reports the main reason he called EMS was he felt like he had a choking effect in his throat.  Pt. Said he was burping cont. And EMS gave him oxygen and he was in a panic.  Pt. Reports no chest pain only burping.  He reports not taking his acid reflux meds in the last 24 hours.

## 2023-04-18 ENCOUNTER — Ambulatory Visit: Payer: Managed Care, Other (non HMO) | Admitting: Family Medicine

## 2023-04-23 ENCOUNTER — Encounter: Payer: Self-pay | Admitting: Family Medicine

## 2023-04-23 MED ORDER — POTASSIUM CHLORIDE CRYS ER 10 MEQ PO TBCR: 10.0000 meq | EXTENDED_RELEASE_TABLET | Freq: Every day | ORAL | 0 refills | Status: DC

## 2023-04-25 ENCOUNTER — Encounter: Payer: Self-pay | Admitting: Family Medicine

## 2023-04-26 ENCOUNTER — Encounter: Payer: Self-pay | Admitting: Family Medicine

## 2023-05-04 ENCOUNTER — Telehealth: Payer: Self-pay | Admitting: Family Medicine

## 2023-05-04 NOTE — Telephone Encounter (Signed)
Pt came in to pickup disability paperwork after stating that he received a call from our office. The paperwork could not be found in the front and I couldn't see a note in pt's chart about call. Informed patient of this they would like to be called again when the paperwork is ready and in the front office for pickup.

## 2023-05-06 ENCOUNTER — Encounter: Payer: Self-pay | Admitting: Family Medicine

## 2023-05-07 NOTE — Telephone Encounter (Signed)
Sent the patient a my chart message that based off our phone conversation he would just ask his company for another set of the paperwork to take to his specialist to complete.  We no longer have paperwork .

## 2023-05-08 ENCOUNTER — Ambulatory Visit: Payer: Managed Care, Other (non HMO) | Admitting: Family Medicine

## 2023-05-23 ENCOUNTER — Ambulatory Visit: Payer: Managed Care, Other (non HMO) | Admitting: Family Medicine

## 2023-06-20 ENCOUNTER — Encounter: Payer: Self-pay | Admitting: Family Medicine

## 2023-06-20 ENCOUNTER — Ambulatory Visit: Payer: Managed Care, Other (non HMO) | Admitting: Family Medicine

## 2023-06-20 VITALS — BP 132/88 | HR 55 | Temp 98.5°F | Ht 69.0 in | Wt 154.1 lb

## 2023-06-20 DIAGNOSIS — E7800 Pure hypercholesterolemia, unspecified: Secondary | ICD-10-CM | POA: Diagnosis not present

## 2023-06-20 DIAGNOSIS — I10 Essential (primary) hypertension: Secondary | ICD-10-CM | POA: Diagnosis not present

## 2023-06-20 LAB — COMPREHENSIVE METABOLIC PANEL
ALT: 18 U/L (ref 0–53)
AST: 24 U/L (ref 0–37)
Albumin: 4.5 g/dL (ref 3.5–5.2)
Alkaline Phosphatase: 52 U/L (ref 39–117)
BUN: 19 mg/dL (ref 6–23)
CO2: 29 mEq/L (ref 19–32)
Calcium: 9.5 mg/dL (ref 8.4–10.5)
Chloride: 99 mEq/L (ref 96–112)
Creatinine, Ser: 1.29 mg/dL (ref 0.40–1.50)
GFR: 57.87 mL/min — ABNORMAL LOW (ref >60.00)
Glucose, Bld: 90 mg/dL (ref 70–99)
Potassium: 3.9 mEq/L (ref 3.5–5.1)
Sodium: 134 mEq/L — ABNORMAL LOW (ref 135–145)
Total Bilirubin: 1.2 mg/dL (ref 0.2–1.2)
Total Protein: 7.2 g/dL (ref 6.0–8.3)

## 2023-06-20 LAB — LIPID PANEL
Cholesterol: 154 mg/dL (ref 0–200)
HDL: 51.3 mg/dL (ref >39.00)
LDL Cholesterol: 93 mg/dL (ref 0–99)
NonHDL: 102.99
Total CHOL/HDL Ratio: 3
Triglycerides: 48 mg/dL (ref 0.0–149.0)
VLDL: 9.6 mg/dL (ref 0.0–40.0)

## 2023-06-20 NOTE — Progress Notes (Signed)
Chief Complaint  Patient presents with   Follow-up    Doing better today    Subjective Joel Young is a 67 y.o. male who presents for hypertension follow up. He does not monitor home blood pressures. He is compliant with medications- Toprol XL 25 mg in AM, 50 mg/d, nidedipine XL 30 mg/d, triamterene-hydrochlorothiazide 18.75-12.5 mg/d. Patient has these side effects of medication: none He is adhering to a healthy diet overall. Current exercise: walking No CP or SOB.   Hyperlipidemia Patient presents for dyslipidemia follow up. Currently being treated with Crestor 5 mg/d and compliance with treatment thus far has been good. He denies myalgias. Diet/exercise as above.  The patient is not known to have coexisting coronary artery disease.   Past Medical History:  Diagnosis Date   Elevated PSA    GERD (gastroesophageal reflux disease)    High cholesterol    Hypertension     Exam BP 132/88 (BP Location: Left Arm, Patient Position: Sitting, Cuff Size: Normal)   Pulse (!) 55   Temp 98.5 F (36.9 C) (Oral)   Ht 5\' 9"  (1.753 m)   Wt 154 lb 2 oz (69.9 kg)   SpO2 99%   BMI 22.76 kg/m  General:  well developed, well nourished, in no apparent distress Heart: Reg rhythm, bradycardic, no bruits, no LE edema Lungs: clear to auscultation, no accessory muscle use Psych: well oriented with normal range of affect and appropriate judgment/insight  Essential hypertension  Pure hypercholesterolemia - Plan: Comprehensive metabolic panel, Lipid panel  Chronic, stable. Cont Toprol XL 25 mg in AM, 50 mg/d, nifedipine XL 30 mg/d, triamterene-hydrochlorothiazide 18.75-12.5 mg/d. Counseled on diet and exercise. Chronic, stable. Cont Crestor 5 mg/d. Ck labs.  F/u in 6 mo. The patient voiced understanding and agreement to the plan.  Jilda Roche [place], DO 06/20/23  10:12 AM

## 2023-06-20 NOTE — Patient Instructions (Signed)
Give us 2-3 business days to get the results of your labs back.   Keep the diet clean and stay active.  I recommend getting the flu shot in mid October. This suggestion would change if the CDC comes out with a different recommendation.   Let us know if you need anything. 

## 2023-09-05 ENCOUNTER — Ambulatory Visit: Payer: Managed Care, Other (non HMO) | Admitting: Sports Medicine

## 2023-09-05 ENCOUNTER — Encounter: Payer: Self-pay | Admitting: Family Medicine

## 2023-09-05 ENCOUNTER — Ambulatory Visit: Payer: Managed Care, Other (non HMO) | Admitting: Family Medicine

## 2023-09-05 VITALS — BP 118/64 | HR 57 | Temp 97.8°F | Ht 69.0 in | Wt 162.4 lb

## 2023-09-05 DIAGNOSIS — M25511 Pain in right shoulder: Secondary | ICD-10-CM | POA: Diagnosis not present

## 2023-09-05 DIAGNOSIS — Z23 Encounter for immunization: Secondary | ICD-10-CM | POA: Diagnosis not present

## 2023-09-05 MED ORDER — PREDNISONE 20 MG PO TABS
40.0000 mg | ORAL_TABLET | Freq: Every day | ORAL | 0 refills | Status: DC
Start: 2023-09-05 — End: 2023-09-10

## 2023-09-05 NOTE — Patient Instructions (Signed)
If you do not hear anything about your referral in the next 1-2 weeks, call our office and ask for an update.  Heat (pad or rice pillow in microwave) over affected area, 10-15 minutes twice daily.   Ice/cold pack over area for 10-15 min twice daily.  OK to take Tylenol 1000 mg (2 extra strength tabs) or 975 mg (3 regular strength tabs) every 6 hours as needed.  Let us know if you need anything.  Biceps Tendon Disruption (Proximal) Rehab Do exercises exactly as told by your health care provider and adjust them as directed. It is normal to feel mild stretching, pulling, tightness, or discomfort as you do these exercises, but you should stop right away if you feel sudden pain or your pain gets worse.  Stretching and range of motion exercises These exercises warm up your muscles and joints and improve the movement and flexibility of your arm and shoulder. These exercises also help to relieve pain and stiffness. Exercise A: Shoulder flexion, standing   Stand facing a wall. Put your left / right hand on the wall. Slide your left / right hand up the wall. Stop when you feel a stretch in your shoulder, or when you reach the angle recommended by your health care provider. Use your other hand to help raise your arm, if needed. As your hand gets higher, you may need to step closer to the wall. Avoid shrugging your shoulder while you raise your arm. To do this, keep your shoulder blade tucked down toward your spine. Hold for 30 seconds. Slowly return to the starting position. Use your other arm to help, if needed. Repeat 2 times. Complete this exercise 3 times per week. Exercise B: Pendulum   Stand near a wall or a surface that you can hold onto for balance. Bend at the waist and let your left / right arm hang straight down. Use your other arm to support you. Relax your arm and shoulder muscles, and move your hips and your trunk so your left / right arm swings freely. Your arm should swing because of  the motion of your body, not because you are using your arm or shoulder muscles. Keep moving so your arm swings in the following directions, as told by your health care provider: Side to side. Forward and backward. In clockwise and counterclockwise circles. Slowly return to the starting position. Repeat 2 times. Complete this exercise 3 times per week.  Strengthening exercises These exercises build strength and endurance in your arm and shoulder. Endurance is the ability to use your muscles for a long time, even after your muscles get tired. Exercise C: Elbow flexion, neutral  Sit on a stable chair without armrests, or stand. Hold a 3-5 lb weight in your left / right hand, or hold an exercise band with both hands. Your palms should face each other at the starting position. Bend your left / right elbow and move your hand up toward your shoulder. Lead with your thumb, and keep your palm facing the same direction. Keep your other arm straight down, in the starting position. Slowly return to the starting position. Repeat 2-3 times. Complete this exercise 3 times per week. Exercise D: Forearm supination   Sit with your left / right forearm on a table. Your elbow should be below shoulder height. Rest your hand over the edge of the table so your palm faces down. If directed, hold a hammer with your left / right hand. Without moving your elbow, slowly rotate  your hand so your palm faces up toward the ceiling. If you are holding a hammer, begin by holding the hammer near the head. When this exercise gets easier for you, hold the hammer farther down the handle. Hold for 3 seconds. Slowly return to the starting position. Repeat 2 times. Complete this exercise 3 times per week. Exercise E: Scapular retraction   Sit in a stable chair without armrests, or stand. Secure an exercise band to a stable object in front of you so the band is at shoulder height. Hold one end of the exercise band in each  hand. Squeeze your shoulder blades together and move your elbows slightly behind you. Do not shrug your shoulders. Hold for 3 seconds. Slowly return to the starting position. Repeat 2 times. Complete this exercise 3 times per week. Exercise F: Scapular protraction, supine   Lie on your back on a firm surface. Hold a 3-5 lb weight in your left / right hand. Raise your left / right arm straight into the air so your hand is directly above your shoulder joint. Push the weight into the air so your shoulder lifts off of the surface that you are lying on. Do not move your head, neck, or back. Hold for 3 seconds. Slowly return to the starting position. Let your muscles relax completely before you repeat this exercise. Repeat 2 times. Complete this exercise 3 times per week. This information is not intended to replace advice given to you by your health care provider. Make sure you discuss any questions you have with your health care provider. Document Released: 10/30/2005 Document Revised: 07/06/2016 Document Reviewed: 10/08/2015 Elsevier Interactive Patient Education  2017 [employer].

## 2023-09-05 NOTE — Progress Notes (Signed)
Musculoskeletal Exam  Patient: Joel Young DOB: [birth year]  DOS: 09/05/2023  SUBJECTIVE:  Chief Complaint:   Chief Complaint  Patient presents with   Shoulder Pain    right    Joel Young is a 67 y.o.  male for evaluation and treatment of R shoulder pain.   Onset:  1 day ago. No inj or change in activity.  Location: anterior shoulder Character:  sharp Progression of issue:  has worsened Associated symptoms: decreased ROM 2/2 pain No bruising, swelling, redness, fevers, catching/locking Treatment: to date has been rest, OTC NSAIDS, and prescription NSAIDS.   Neurovascular symptoms: no  Past Medical History:  Diagnosis Date   Elevated PSA    GERD (gastroesophageal reflux disease)    High cholesterol    Hypertension     Objective: VITAL SIGNS: BP 118/64 (BP Location: Left Arm, Patient Position: Sitting, Cuff Size: Normal)   Pulse (!) 57   Temp 97.8 F (36.6 C) (Oral)   Ht 5\' 9"  (1.753 m)   Wt 162 lb 6 oz (73.7 kg)   SpO2 99%   BMI 23.98 kg/m  Constitutional: Well formed, well developed. No acute distress. Thorax & Lungs: No accessory muscle use Musculoskeletal: R shoulder.   Normal active range of motion: no.   Normal passive range of motion: no Tenderness to palpation: yes over coracoid process Deformity: no Ecchymosis: no Erythema/excessive warmth: No Tests positive: Yergason's, Speeds Tests negative: Neer's, Hawkins, cross over, liftoff, empty can equivocal 2/2  Neurologic: Normal sensory function. Psychiatric: Normal mood. Age appropriate judgment and insight. Alert & oriented x 3.    Assessment:  Need for influenza vaccination - Plan: Flu Vaccine Trivalent High Dose (Fluad)  Plan: Suspect biceps tendon. Will refer to sports med given severity. 5 d pred burst in meanwhile. Do not suspect pyomyositis or infection. Stretches/exercises, heat, ice, Tylenol.  F/u as originally scheduled. The patient voiced understanding and agreement to the  plan.   Jilda Roche [place], DO 09/05/23  11:24 AM

## 2023-09-06 ENCOUNTER — Encounter: Payer: Self-pay | Admitting: Family Medicine

## 2023-09-06 ENCOUNTER — Other Ambulatory Visit: Payer: Self-pay | Admitting: Family Medicine

## 2023-09-06 ENCOUNTER — Other Ambulatory Visit (HOSPITAL_BASED_OUTPATIENT_CLINIC_OR_DEPARTMENT_OTHER): Payer: Self-pay

## 2023-09-06 DIAGNOSIS — M25511 Pain in right shoulder: Secondary | ICD-10-CM

## 2023-09-06 MED ORDER — PREDNISONE 20 MG PO TABS
40.0000 mg | ORAL_TABLET | Freq: Every day | ORAL | 0 refills | Status: AC
Start: 2023-09-09 — End: 2023-09-12
  Filled 2023-09-06: qty 6, 3d supply, fill #0

## 2023-09-12 ENCOUNTER — Ambulatory Visit (INDEPENDENT_AMBULATORY_CARE_PROVIDER_SITE_OTHER): Payer: Managed Care, Other (non HMO) | Admitting: Sports Medicine

## 2023-09-12 ENCOUNTER — Encounter: Payer: Self-pay | Admitting: Sports Medicine

## 2023-09-12 VITALS — BP 120/72 | Ht 69.0 in | Wt 162.0 lb

## 2023-09-12 DIAGNOSIS — M7521 Bicipital tendinitis, right shoulder: Secondary | ICD-10-CM | POA: Diagnosis not present

## 2023-09-12 MED ORDER — MELOXICAM 15 MG PO TABS: ORAL_TABLET | ORAL | 0 refills | Status: AC

## 2023-09-12 NOTE — Progress Notes (Signed)
   Subjective:    Patient ID: Joel Young, male    DOB: 10/22/1956, 68 y.o.   MRN: [id number]  HPI chief complaint: Right shoulder pain  Patient is a very pleasant right-hand-dominant 67 year old male that presents today with several days of anterior right shoulder pain.  He does not recall any specific trauma.  He does not remember any specific increase in shoulder activity.  His pain is localized to the bicipital groove.  He recently saw his PCP and was placed on a 5-day course of prednisone which has been helpful.  He states that his pain is greater than 90% improved.  Past medical history reviewed Medications reviewed Allergies reviewed  Review of Systems As above    Objective:   Physical Exam  Well-developed, well-nourished.  No acute distress  Right shoulder: Full active and passive shoulder range of motion.  He is tender to palpation over the proximal biceps tendon in the bicipital groove.  Positive speeds test.  Negative empty can.  Rotator cuff strength is 5/5.  Good pulses distally.      Assessment & Plan:   Right shoulder pain secondary to proximal biceps tendinitis  Pain has improved quite a bit after finishing a 5-day prednisone course.  I will prescribe meloxicam 15 mg to take only as needed.  He will take this with food and is cautioned about GI upset.  If his symptoms once again worsen then consider merits of physical therapy.  Follow-up for ongoing or recalcitrant issues.  This note was dictated using Dragon naturally speaking software and may contain errors in syntax, spelling, or content which have not been identified prior to signing this note.

## 2023-09-20 ENCOUNTER — Encounter (HOSPITAL_BASED_OUTPATIENT_CLINIC_OR_DEPARTMENT_OTHER): Payer: Self-pay | Admitting: *Deleted

## 2023-09-20 ENCOUNTER — Emergency Department (HOSPITAL_BASED_OUTPATIENT_CLINIC_OR_DEPARTMENT_OTHER): Payer: Managed Care, Other (non HMO)

## 2023-09-20 ENCOUNTER — Other Ambulatory Visit: Payer: Self-pay

## 2023-09-20 ENCOUNTER — Emergency Department (HOSPITAL_BASED_OUTPATIENT_CLINIC_OR_DEPARTMENT_OTHER)
Admission: EM | Admit: 2023-09-20 | Discharge: 2023-09-20 | Disposition: A | Discharge status: Home / Self Care | Payer: Managed Care, Other (non HMO) | Attending: Emergency Medicine | Admitting: Emergency Medicine

## 2023-09-20 DIAGNOSIS — Z79899 Other long term (current) drug therapy: Secondary | ICD-10-CM | POA: Insufficient documentation

## 2023-09-20 DIAGNOSIS — E871 Hypo-osmolality and hyponatremia: Secondary | ICD-10-CM | POA: Diagnosis not present

## 2023-09-20 DIAGNOSIS — I10 Essential (primary) hypertension: Secondary | ICD-10-CM | POA: Diagnosis not present

## 2023-09-20 DIAGNOSIS — R519 Headache, unspecified: Secondary | ICD-10-CM | POA: Diagnosis present

## 2023-09-20 LAB — CBC
HCT: 44.6 % (ref 39.0–52.0)
Hemoglobin: 15.3 g/dL (ref 13.0–17.0)
MCH: 30.9 pg (ref 26.0–34.0)
MCHC: 34.3 g/dL (ref 30.0–36.0)
MCV: 90.1 fL (ref 80.0–100.0)
Platelets: 189 10*3/uL (ref 150–400)
RBC: 4.95 MIL/uL (ref 4.22–5.81)
RDW: 14.2 % (ref 11.5–15.5)
WBC: 7 10*3/uL (ref 4.0–10.5)
nRBC: 0 % (ref 0.0–0.2)

## 2023-09-20 LAB — BASIC METABOLIC PANEL
Anion gap: 8 (ref 5–15)
BUN: 13 mg/dL (ref 8–23)
CO2: 27 mmol/L (ref 22–32)
Calcium: 10.1 mg/dL (ref 8.9–10.3)
Chloride: 99 mmol/L (ref 98–111)
Creatinine, Ser: 1.04 mg/dL (ref 0.61–1.24)
GFR, Estimated: >60 mL/min (ref >60)
Glucose, Bld: 91 mg/dL (ref 70–99)
Potassium: 3.8 mmol/L (ref 3.5–5.1)
Sodium: 134 mmol/L — ABNORMAL LOW (ref 135–145)

## 2023-09-20 MED ORDER — KETOROLAC TROMETHAMINE 15 MG/ML IJ SOLN
15.0000 mg | Freq: Once | INTRAMUSCULAR | Status: AC
  Administered 2023-09-20: 15 mg via INTRAVENOUS
  Filled 2023-09-20: qty 1

## 2023-09-20 NOTE — ED Triage Notes (Signed)
Pt referred to ED from [facility] for HTN with headache today. BP at urgent care was 220/120 and 216/122 on the recheck after having taken 50 mg Metoprolol and 15mg  Triamterene-hydrochlorothiazide. Headache started at 930am today. No resolution after 100 mg of tylenol. No neuro deficits noted.

## 2023-09-20 NOTE — ED Notes (Signed)
Pt states he went to [facility] for his job and was 'not consistent' with his home medications during this time. Pt states he returned from [facility] yesterday.

## 2023-09-20 NOTE — Discharge Instructions (Addendum)
As discussed, workup today overall reassuring.  CT imaging of your head did not show signs of stroke.  Recommend getting back on your at home blood pressure medication.  Continue to log blood pressure at home.  Please not hesitate to return if the worrisome signs and symptoms we discussed become apparent.
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
## 2023-09-21 ENCOUNTER — Ambulatory Visit (INDEPENDENT_AMBULATORY_CARE_PROVIDER_SITE_OTHER): Payer: Managed Care, Other (non HMO) | Admitting: Family Medicine

## 2023-09-21 VITALS — BP 130/80 | HR 87 | Temp 97.8°F | Resp 16 | Ht 69.0 in | Wt 162.8 lb

## 2023-09-21 DIAGNOSIS — I10 Essential (primary) hypertension: Secondary | ICD-10-CM

## 2023-09-21 MED ORDER — TRIAMTERENE-HCTZ 37.5-25 MG PO TABS: 1.0000 | ORAL_TABLET | Freq: Every day | ORAL | 3 refills | Status: DC

## 2023-09-21 NOTE — Patient Instructions (Addendum)
Keep the diet clean and stay active.  Check your blood pressures 2-3 times per week, alternating the time of day you check it. If it is high, considering waiting 1-2 minutes and rechecking. If it gets higher, your anxiety is likely creeping up and we should avoid rechecking.   I want your blood pressure less than 140 on the top and less than 90 on the bottom consistently. Both goals must be met (ie, 150/70 is too high even though the 70 on the bottom is desirable).   Metoprolol: take 1/2 tab twice daily for 5 days then take 1/2 tab daily for 5 days and then stop.   Send me a message in 2 weeks with your BP readings.   Let us know if you need anything.

## 2023-09-21 NOTE — Progress Notes (Signed)
Chief Complaint  Patient presents with   Follow-up    Follow up    Subjective Joel Young is a 67 y.o. male who presents for hypertension follow up. He does monitor home blood pressures. Blood pressures ranging from 130-190's/100's on average over the pas tmo. He is compliant with medications-Maxide HCT 37.5-25 mg tab (1/2 tab) daily- did not take 10 d prior to going to ED, Toprol XL 25 mg in AM, 50 mg in PM. Patient has these side effects of medication: none Has been having HA's a/w elevated BP. CT head neg. HA improving with lowering BP He is adhering to a healthy diet overall. Current exercise: Walking No CP or SOB.    Past Medical History:  Diagnosis Date   Elevated PSA    GERD (gastroesophageal reflux disease)    High cholesterol    Hypertension     Exam BP 130/80 (BP Location: Left Arm, Patient Position: Sitting, Cuff Size: Normal)   Pulse 87   Temp 97.8 F (36.6 C) (Oral)   Resp 16   Ht 5\' 9"  (1.753 m)   Wt 162 lb 12.8 oz (73.8 kg)   SpO2 99%   BMI 24.04 kg/m  General:  well developed, well nourished, in no apparent distress Heart: RRR, no bruits, no LE edema Lungs: clear to auscultation, no accessory muscle use Psych: well oriented with normal range of affect and appropriate judgment/insight  Essential hypertension - Plan: triamterene-hydrochlorothiazide (MAXZIDE-25) 37.5-25 MG tablet  Chronic, uncontrolled. Recheck is 142/88. Metoprolol: take 1/2 tab twice daily for 5 days then take 1/2 tab daily for 5 days and then stop. Increase to a full tab of the triamterene-hct 37.5 - 25 mg/d. Steadily stop the metoprolol. Monitor BP at home. Send message in 2 weeks w results. Counseled on diet and exercise. F/u in 1 mo. The patient voiced understanding and agreement to the plan.  Jilda Roche [place], DO 09/21/23  3:19 PM

## 2023-10-24 ENCOUNTER — Encounter: Payer: Self-pay | Admitting: Family Medicine

## 2023-10-24 ENCOUNTER — Ambulatory Visit (INDEPENDENT_AMBULATORY_CARE_PROVIDER_SITE_OTHER): Payer: Managed Care, Other (non HMO) | Admitting: Family Medicine

## 2023-10-24 VITALS — BP 126/80 | HR 93 | Temp 98.0°F | Resp 16 | Ht 69.0 in | Wt 165.0 lb

## 2023-10-24 DIAGNOSIS — I10 Essential (primary) hypertension: Secondary | ICD-10-CM

## 2023-10-24 NOTE — Progress Notes (Signed)
CC: F/u  Subjective Joel Young is a 67 y.o. male who presents for hypertension follow up. He does monitor home blood pressures. Blood pressures ranging from 120's/70's on average. He is compliant with medications- Maxzide-hydrochlorothiazide 37.5-25 mg/d Patient has these side effects of medication: none He is usually adhering to a healthy diet overall. Current exercise: walking No CP or SOB.    Past Medical History:  Diagnosis Date   Elevated PSA    GERD (gastroesophageal reflux disease)    High cholesterol    Hypertension     Exam BP 126/80 (BP Location: Left Arm, Patient Position: Sitting, Cuff Size: Normal)   Pulse 93   Temp 98 F (36.7 C) (Esophageal)   Resp 16   Ht 5\' 9"  (1.753 m)   Wt 165 lb (74.8 kg)   SpO2 96%   BMI 24.37 kg/m  General:  well developed, well nourished, in no apparent distress Heart: RRR, no bruits, no LE edema Lungs: clear to auscultation, no accessory muscle use Psych: well oriented with normal range of affect and appropriate judgment/insight  Essential hypertension  Raynaud's phenomenon without gangrene  Chronic, stable. Cont Maxzide-hydrochlorothiazide 37.5-25 mg/d. Counseled on diet and exercise. F/u in 6 mo. The patient voiced understanding and agreement to the plan.  Jilda Roche [place], DO 10/24/23  1:17 PM

## 2023-10-24 NOTE — Patient Instructions (Addendum)
Keep the diet clean and stay active.  Because your blood pressure is well-controlled, you no longer have to check your blood pressure at home anymore unless you wish. Some people check it twice daily every day and some people stop altogether. Either or anything in between is fine. Strong work!   Aim to do some physical exertion for 150 minutes per week. This is typically divided into 5 days per week, 30 minutes per day. The activity should be enough to get your heart rate up. Anything is better than nothing if you have time constraints.  Let us know if you need anything.

## 2023-10-31 ENCOUNTER — Encounter: Payer: Self-pay | Admitting: Family Medicine

## 2023-11-01 ENCOUNTER — Ambulatory Visit: Payer: Managed Care, Other (non HMO) | Admitting: Medical

## 2023-11-01 ENCOUNTER — Encounter: Payer: Self-pay | Admitting: Medical

## 2023-11-01 VITALS — BP 148/80 | HR 61 | Temp 97.9°F | Resp 18 | Ht 69.0 in | Wt 168.2 lb

## 2023-11-01 DIAGNOSIS — R0982 Postnasal drip: Secondary | ICD-10-CM

## 2023-11-01 DIAGNOSIS — R0981 Nasal congestion: Secondary | ICD-10-CM

## 2023-11-01 DIAGNOSIS — I10 Essential (primary) hypertension: Secondary | ICD-10-CM | POA: Diagnosis not present

## 2023-11-01 DIAGNOSIS — R059 Cough, unspecified: Secondary | ICD-10-CM

## 2023-11-01 MED ORDER — FLUTICASONE PROPIONATE 50 MCG/ACT NA SUSP: 2.0000 | Freq: Every day | NASAL | 1 refills | Status: DC

## 2023-11-01 MED ORDER — LEVOCETIRIZINE DIHYDROCHLORIDE 5 MG PO TABS: 5.0000 mg | ORAL_TABLET | Freq: Every evening | ORAL | 3 refills | Status: DC

## 2023-11-01 MED ORDER — AZITHROMYCIN 250 MG PO TABS: ORAL_TABLET | ORAL | 0 refills | Status: AC

## 2023-11-01 MED ORDER — BENZONATATE 100 MG PO CAPS: 100.0000 mg | ORAL_CAPSULE | Freq: Three times a day (TID) | ORAL | 0 refills | Status: DC | PRN

## 2023-11-01 NOTE — Progress Notes (Signed)
Subjective:    Patient ID: Joel Young, male    DOB: 23-Feb-1956, 67 y.o.   MRN: [id number]  HPI  Discussed the use of AI scribe software for clinical note transcription with the patient, who gave verbal consent to proceed.  History of Present Illness   The patient, with a recent travel history to [place], has been experiencing a dry cough and itchy throat for the past two weeks. The onset of symptoms coincided with their return from [place]. The cough was initially accompanied by a runny nose, which the patient attempted to manage with Mucinex. The patient reports a gradual improvement in symptoms over the past two weeks, with the cough remaining dry and non-productive. The patient denies any presence of yellow mucus, fever, chills, sweats, body aches, swollen tonsils, sinus pain, wheezing, or swelling of the lips or tongue. The patient also denies any history of seasonal allergies. Despite the improvement, the patient is concerned that the cough has not completely resolved. The patient also reports frequent nasal congestion and postnasal drainage, which they attribute to cold weather. The patient is scheduled to travel back to [place] in the near future.       Past Medical History:  Diagnosis Date   Elevated PSA    GERD (gastroesophageal reflux disease)    High cholesterol    Hypertension      Social History   Socioeconomic History   Marital status: Married    Spouse name: Not on file   Number of children: Not on file   Years of education: Not on file   Highest education level: Master's degree (e.g., MA, MS, MEng, MEd, MSW, MBA)  Occupational History   Not on file  Tobacco Use   Smoking status: Never   Smokeless tobacco: Never  Substance and Sexual Activity   Alcohol use: Not Currently   Drug use: No   Sexual activity: Yes    Partners: Female  Other Topics Concern   Not on file  Social History Narrative   Not on file   Social Drivers of Health   Financial Resource Strain: Low  Risk  (10/31/2023)   Overall Financial Resource Strain (CARDIA)    Difficulty of Paying Living Expenses: Not hard at all  Food Insecurity: No Food Insecurity (10/31/2023)   Hunger Vital Sign    Worried About Running Out of Food in the Last Year: Never true    Ran Out of Food in the Last Year: Never true  Transportation Needs: No Transportation Needs (10/31/2023)   PRAPARE - Administrator, Civil Service (Medical): No    Lack of Transportation (Non-Medical): No  Physical Activity: Inactive (09/21/2023)   Exercise Vital Sign    Days of Exercise per Week: 0 days    Minutes of Exercise per Session: 60 min  Stress: Stress Concern Present (10/31/2023)   [employer] of Occupational Health - Occupational Stress Questionnaire    Feeling of Stress : To some extent  Social Connections: Moderately Integrated (10/31/2023)   Social Connection and Isolation Panel [NHANES]    Frequency of Communication with Friends and Family: More than three times a week    Frequency of Social Gatherings with Friends and Family: More than three times a week    Attends Religious Services: 1 to 4 times per year    Active Member of [employer] or Organizations: No    Attends Banker Meetings: Not on file    Marital Status: Married  Catering manager Violence: Not  on file    Past Surgical History:  Procedure Laterality Date   APPENDECTOMY     BACK SURGERY     two back surgeries to help with sciatica    Family History  Problem Relation Age of Onset   Prostate cancer Father 23    Allergies  Allergen Reactions   Caffeine Other (See Comments)    tremors   Ciprofloxacin    Famotidine     Causes heart to race   Keflex [Cephalexin] Hives    Current Outpatient Medications on File Prior to Visit  Medication Sig Dispense Refill   cholecalciferol (VITAMIN D) 1000 units tablet Take 1,000 Units by mouth daily.     finasteride (PROSCAR) 5 MG tablet Take 1 tablet (5 mg total) by mouth daily.  90 tablet 3   meloxicam (MOBIC) 15 MG tablet Take 1 by mouth daily with food as needed for shoulder pain. 30 tablet 0   pantoprazole (PROTONIX) 40 MG tablet Take 1 tablet (40 mg total) by mouth 2 (two) times daily before a meal. 60 tablet 2   rosuvastatin (CRESTOR) 5 MG tablet Take 1 tablet (5 mg total) by mouth daily. 90 tablet 3   triamterene-hydrochlorothiazide (MAXZIDE-25) 37.5-25 MG tablet Take 1 tablet by mouth daily. 90 tablet 3   No current facility-administered medications on file prior to visit.    BP (!) 148/80   Pulse 61   Temp 97.9 F (36.6 C)   Resp 18   Ht 5\' 9"  (1.753 m)   Wt 168 lb 3.2 oz (76.3 kg)   SpO2 100%   BMI 24.84 kg/m      Review of Systems  Constitutional:  Negative for chills, fatigue and fever.  HENT:  Positive for congestion and postnasal drip.   Respiratory:  Positive for cough. Negative for chest tightness and wheezing.   Cardiovascular:  Negative for chest pain and palpitations.  Gastrointestinal:  Negative for abdominal pain.  Musculoskeletal:  Negative for back pain.  Neurological:  Negative for dizziness and headaches.  Hematological:  Negative for adenopathy. Does not bruise/bleed easily.  Psychiatric/Behavioral:  Negative for behavioral problems and confusion.        Objective:   Physical Exam  General- No acute distress. Pleasant patient. Neck- Full range of motion, no jvd Lungs- Clear, even and unlabored. Heart- regular rate and rhythm. Neurologic- CNII- XII grossly intact.  HEENT: Sinuses not tender. Nasal mucosa swollen, turbinates red. Throat normal. +pnd. Ears- canals clear and normal tms.      Assessment & Plan:   Patient Instructions  Allergic rhinintis vs URI(favor allergies over uri) Dry cough, itchy throat, and nasal congestion for two weeks. Symptoms are improving. No fever, chills, sweats, body aches, or swollen tonsils. No wheezing, lip or tongue swelling. Nasal turbinates are red and swollen. Symptoms are  suggestive more of allergies. -Start Xyzal at night. -Use nasal spray flonase -Benzonatate for cough -Hold azithromycin and start only if symptoms worsen (coughing up mucus or sinus pain). Available as traveling over holidays.  htn- bp high on repeat check but skipped med this am and just left dentist office. -take bp med when you get home.  Follow up as regularly scheduled pcp or sooner if needed.

## 2023-11-01 NOTE — Patient Instructions (Addendum)
Allergic rhinintis vs URI(favor allergies over uri) Dry cough, itchy throat, and nasal congestion for two weeks. Symptoms are improving. No fever, chills, sweats, body aches, or swollen tonsils. No wheezing, lip or tongue swelling. Nasal turbinates are red and swollen. Symptoms are suggestive more of allergies. -Start Xyzal at night. -Use nasal spray flonase -Benzonatate for cough -Hold azithromycin and start only if symptoms worsen (coughing up mucus or sinus pain). Available as traveling over holidays.  Htn- bp high on repeat check but skipped med this am and just left dentist office. -take bp med when you get home.  Follow up as regularly scheduled pcp or sooner if needed.

## 2023-11-23 ENCOUNTER — Other Ambulatory Visit: Payer: Self-pay | Admitting: Medical

## 2023-12-01 ENCOUNTER — Other Ambulatory Visit: Payer: Self-pay | Admitting: Family Medicine

## 2023-12-01 DIAGNOSIS — I10 Essential (primary) hypertension: Secondary | ICD-10-CM

## 2023-12-12 ENCOUNTER — Encounter (HOSPITAL_BASED_OUTPATIENT_CLINIC_OR_DEPARTMENT_OTHER): Payer: Self-pay | Admitting: Emergency Medicine

## 2023-12-12 ENCOUNTER — Emergency Department (HOSPITAL_BASED_OUTPATIENT_CLINIC_OR_DEPARTMENT_OTHER)
Admission: EM | Admit: 2023-12-12 | Discharge: 2023-12-12 | Disposition: A | Discharge status: Home / Self Care | Payer: Managed Care, Other (non HMO) | Attending: Emergency Medicine | Admitting: Emergency Medicine

## 2023-12-12 ENCOUNTER — Ambulatory Visit: Payer: Self-pay | Admitting: Family Medicine

## 2023-12-12 ENCOUNTER — Other Ambulatory Visit: Payer: Self-pay

## 2023-12-12 DIAGNOSIS — I10 Essential (primary) hypertension: Secondary | ICD-10-CM | POA: Diagnosis present

## 2023-12-12 DIAGNOSIS — I159 Secondary hypertension, unspecified: Secondary | ICD-10-CM | POA: Insufficient documentation

## 2023-12-12 DIAGNOSIS — Z79899 Other long term (current) drug therapy: Secondary | ICD-10-CM | POA: Insufficient documentation

## 2023-12-12 NOTE — ED Triage Notes (Signed)
Sent from dentist d/t HTN. States he has not taking his meds today. Denies any CP or SHOB.

## 2023-12-12 NOTE — Telephone Encounter (Signed)
  Chief Complaint: High blood pressure Symptoms: High blood pressure Frequency: Now Pertinent Negatives: Patient denies Chest pain, numbness/weakness, headache, vision changes Disposition: [x] ED /[] Urgent Care (no appt availability in office) / [] Appointment(In office/virtual)/ []  [place] [facility]/ [] Home Care/ [] Refused Recommended Disposition /[] [place] [facility]/ []  Follow-up with PCP Additional Notes: Patient called in for complaints of being refused dental cleaning due to high blood pressure. Patient states that blood pressure was taken at the dental office with readings of 186/103 and 210/105 five minutes apart. Patients states that BP got higher with each reading, lowest reading was 174/100. Patient takes BP med at night and hasn't taken his BP med yet, but usually takes it as prescribed. Patient denies vision changes, chest pain, numbness/weakness, and headache, although he stated that he had a headache on the right side of his head that has since subsided. Patient advised by this RN to take BP medication immediately however patient stated he was not at home to do so. Patient also advised by this RN per protocol to go immediately to the ER, to which patient was agreeable and verbalized understanding.   Copied from CRM [id number]. Topic: Clinical - Red Word Triage >> Dec 12, 2023 12:47 PM Denese Killings wrote: Red Word that prompted transfer to Nurse Triage: Patient went in for cleaning and could not get a cleaning becuase Blood pressure is too high 174/100 and then 186/103. Patient does not have any symptoms but stated he panic and shivers sometimes. Reason for Disposition  [1] Systolic BP  >= 160 OR Diastolic >= 100 AND [2] cardiac (e.g., breathing difficulty, chest pain) or neurologic symptoms (e.g., new-onset blurred or double vision, unsteady gait)  Answer Assessment - Initial Assessment Questions 1. BLOOD PRESSURE: "What is the blood pressure?" "Did you take at least two  measurements 5 minutes apart?"     210/105, 186/103, 174/100 2. ONSET: "When did you take your blood pressure?"     Half hour ago 3. HOW: "How did you take your blood pressure?" (e.g., automatic home BP monitor, visiting nurse)     Automatic h 4. HISTORY: "Do you have a history of high blood pressure?"    Yes 5. MEDICINES: "Are you taking any medicines for blood pressure?" "Have you missed any doses recently?"     Missed 6. OTHER SYMPTOMS: "Do you have any symptoms?" (e.g., blurred vision, chest pain, difficulty breathing, headache, weakness)     "I had right side headache."  Protocols used: Blood Pressure - High-A-AH

## 2023-12-12 NOTE — ED Provider Notes (Signed)
[place] [facility] Provider Note   CSN: [id number] Arrival date & time: 12/12/23  1300     History  Chief Complaint  Patient presents with   Hypertension    Joel Young is a 68 y.o. male.  With a history of hypertension hyperlipidemia presents to ED for hypertension.  Patient had a routine dentist appointment this morning for cleaning.  After repeated measurements he was noted to be hypertensive and was sent here for further evaluation.  Systolic blood pressure was in the 190s at the dentist office.  Patient notes he did not take his antihypertensive medication (Maxzide 25) this morning because of a dentist appointment.  He has not experienced any symptoms from hypertension.  Denies headaches, chest pain, change in vision, shortness of breath   Hypertension       Home Medications Prior to Admission medications   Medication Sig Start Date End Date Taking? Authorizing Provider  benzonatate (TESSALON) 100 MG capsule Take 1 capsule (100 mg total) by mouth 3 (three) times daily as needed for cough. 11/01/23   Saguier, Ramon Dredge, PA-C  cholecalciferol (VITAMIN D) 1000 units tablet Take 1,000 Units by mouth daily.    [provider]  finasteride (PROSCAR) 5 MG tablet Take 1 tablet (5 mg total) by mouth daily. 02/28/23   Sharlene Dory, DO  fluticasone [facility]) 50 MCG/ACT nasal spray SPRAY 2 SPRAYS INTO EACH NOSTRIL EVERY DAY 11/26/23   Wendling, Jilda Roche, DO  levocetirizine (XYZAL) 5 MG tablet Take 1 tablet (5 mg total) by mouth every evening. 11/01/23   Saguier, Ramon Dredge, PA-C  meloxicam (MOBIC) 15 MG tablet Take 1 by mouth daily with food as needed for shoulder pain. 09/12/23   Ralene Cork, DO  pantoprazole (PROTONIX) 40 MG tablet Take 1 tablet (40 mg total) by mouth 2 (two) times daily before a meal. 04/17/23   Rondel Baton, MD  rosuvastatin (CRESTOR) 5 MG tablet Take 1 tablet (5 mg total) by mouth daily. 02/28/23    Sharlene Dory, DO  triamterene-hydrochlorothiazide (MAXZIDE-25) 37.5-25 MG tablet TAKE 1/2 TABLET BY MOUTH DAILY 12/03/23   Sharlene Dory, DO      Allergies    Caffeine, Ciprofloxacin, Famotidine, and Keflex [cephalexin]    Review of Systems   Review of Systems  Physical Exam Updated Vital Signs BP (!) 185/123 (BP Location: Right Arm)   Pulse 97   Temp 98.4 F (36.9 C) (Oral)   Resp (!) 24   Ht 5\' 9"  (1.753 m)   Wt 74.8 kg   SpO2 99%   BMI 24.37 kg/m  Physical Exam Vitals and nursing note reviewed.  HENT:     Head: Normocephalic and atraumatic.  Eyes:     Pupils: Pupils are equal, round, and reactive to light.  Cardiovascular:     Rate and Rhythm: Normal rate and regular rhythm.  Pulmonary:     Effort: Pulmonary effort is normal.     Breath sounds: Normal breath sounds.  Abdominal:     Palpations: Abdomen is soft.     Tenderness: There is no abdominal tenderness.  Skin:    General: Skin is warm and dry.  Neurological:     Mental Status: He is alert.  Psychiatric:        Mood and Affect: Mood normal.     ED Results / Procedures / Treatments   Labs (all labs ordered are listed, but only abnormal results are displayed) Labs Reviewed - No data  to display  EKG EKG Interpretation Date/Time:  Wednesday December 12 2023 15:28:17 EST Ventricular Rate:  60 PR Interval:    QRS Duration:  98 QT Interval:  412 QTC Calculation: 412 R Axis:   -57  Text Interpretation: Normal sinus rhythm Left anterior fascicular block Probable LVH with secondary repol abnrm Anterior ST elevation, probably due to LVH Confirmed by Estelle June [phone]) on 12/12/2023 3:31:55 PM  Radiology No results found.  Procedures Procedures    Medications Ordered in ED Medications - No data to display  ED Course/ Medical Decision Making/ A&P                                 Medical Decision Making 68 year old male with history as above presenting for asymptomatic  hypertension.  Noted to be hypertensive on repeated measurements in dentist office.  Pressure repeatedly with systolic in the 180s here.  Given that he is asymptomatic no need for further workup at this time.  Most likely etiology of hypertensive episode today would be nonadherence to his antihypertensive regimen.  He has his medications at home.  EKG shows LVH with no ischemic changes or signs of dysrhythmia.  Stable for discharge with PCP follow-up.           Final Clinical Impression(s) / ED Diagnoses Final diagnoses:  Secondary hypertension    Rx / DC Orders ED Discharge Orders     None         Royanne Foots, DO 12/12/23 1532

## 2023-12-12 NOTE — Discharge Instructions (Addendum)
You were seen in the emergency department for high blood pressure Your EKG did not show any concerning findings Given that you are not having any symptoms in the high blood pressure there is no need for blood work or other testing today Your blood pressure is most likely high because he did not take your blood pressure medication this morning Go home and take your blood pressure medication Continue taking all previous prescribed medications Follow-up with your primary doctor within 1 week Return to the emerged part for chest pain trouble breathing headaches or any other concerns

## 2023-12-26 ENCOUNTER — Encounter: Payer: Managed Care, Other (non HMO) | Admitting: Family Medicine

## 2024-01-27 ENCOUNTER — Other Ambulatory Visit: Payer: Self-pay | Admitting: Medical

## 2024-04-23 ENCOUNTER — Encounter: Payer: Self-pay | Admitting: Family Medicine

## 2024-04-23 ENCOUNTER — Ambulatory Visit (INDEPENDENT_AMBULATORY_CARE_PROVIDER_SITE_OTHER): Payer: Managed Care, Other (non HMO) | Admitting: Family Medicine

## 2024-04-23 VITALS — BP 124/72 | HR 65 | Temp 98.0°F | Resp 16 | Ht 69.0 in | Wt 169.2 lb

## 2024-04-23 DIAGNOSIS — Z0000 Encounter for general adult medical examination without abnormal findings: Secondary | ICD-10-CM | POA: Diagnosis not present

## 2024-04-23 DIAGNOSIS — E559 Vitamin D deficiency, unspecified: Secondary | ICD-10-CM | POA: Diagnosis not present

## 2024-04-23 DIAGNOSIS — F411 Generalized anxiety disorder: Secondary | ICD-10-CM

## 2024-04-23 DIAGNOSIS — I10 Essential (primary) hypertension: Secondary | ICD-10-CM

## 2024-04-23 DIAGNOSIS — Z125 Encounter for screening for malignant neoplasm of prostate: Secondary | ICD-10-CM | POA: Diagnosis not present

## 2024-04-23 LAB — CBC
HCT: 48.3 % (ref 38.5–50.0)
Hemoglobin: 15.7 g/dL (ref 13.2–17.1)
MCH: 30.2 pg (ref 27.0–33.0)
MCHC: 32.5 g/dL (ref 32.0–36.0)
MCV: 92.9 fL (ref 80.0–100.0)
MPV: 10.8 fL (ref 7.5–12.5)
Platelets: 154 10*3/uL (ref 140–400)
RBC: 5.2 10*6/uL (ref 4.20–5.80)
RDW: 13.3 % (ref 11.0–15.0)
WBC: 3.3 10*3/uL — ABNORMAL LOW (ref 3.8–10.8)

## 2024-04-23 LAB — COMPREHENSIVE METABOLIC PANEL WITH GFR
AG Ratio: 1.6 (calc) (ref 1.0–2.5)
ALT: 19 U/L (ref 9–46)
AST: 23 U/L (ref 10–35)
Albumin: 4.4 g/dL (ref 3.6–5.1)
Alkaline phosphatase (APISO): 69 U/L (ref 35–144)
BUN: 18 mg/dL (ref 7–25)
CO2: 29 mmol/L (ref 20–32)
Calcium: 9.7 mg/dL (ref 8.6–10.3)
Chloride: 100 mmol/L (ref 98–110)
Creat: 1.22 mg/dL (ref 0.70–1.35)
Globulin: 2.8 g/dL (ref 1.9–3.7)
Glucose, Bld: 93 mg/dL (ref 65–99)
Potassium: 4.3 mmol/L (ref 3.5–5.3)
Sodium: 135 mmol/L (ref 135–146)
Total Bilirubin: 1.5 mg/dL — ABNORMAL HIGH (ref 0.2–1.2)
Total Protein: 7.2 g/dL (ref 6.1–8.1)
eGFR: 65 mL/min/{1.73_m2} (ref >=60)

## 2024-04-23 LAB — LIPID PANEL
Cholesterol: 175 mg/dL (ref <200)
HDL: 54 mg/dL (ref >=40)
LDL Cholesterol (Calc): 107 mg/dL — ABNORMAL HIGH
Non-HDL Cholesterol (Calc): 121 mg/dL (ref <130)
Total CHOL/HDL Ratio: 3.2 (calc) (ref <5.0)
Triglycerides: 54 mg/dL (ref <150)

## 2024-04-23 LAB — PSA: PSA: 3.32 ng/mL (ref <=4.00)

## 2024-04-23 LAB — VITAMIN D 25 HYDROXY (VIT D DEFICIENCY, FRACTURES): Vit D, 25-Hydroxy: 40 ng/mL (ref 30–100)

## 2024-04-23 MED ORDER — CITALOPRAM HYDROBROMIDE 10 MG PO TABS: 10.0000 mg | ORAL_TABLET | Freq: Every day | ORAL | 3 refills | Status: DC
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
## 2024-04-23 NOTE — Patient Instructions (Addendum)
 Give [place] 2-3 business days to get the results of your labs back.   Keep the diet clean and stay active.  Please consider adding some weight resistance exercise to your routine. Consider yoga as well.   Please get me a copy of your advanced directive form at your convenience.   Please consider counseling. Contact [phone] to schedule an appointment or inquire about cost/insurance coverage.  [facility] located at [address], [place], [place].  Phone number = [phone].  Dr. Regan Lemming - Adult Psychiatry.    [facility] located at [address] [place], [place], [place]. Phone number = [phone].   [facility] located at [address] [place], [place].  Phone number = [phone].   [facility] located at [address] [place], [place], [place].  Phone number = [phone].  Let us know if you need anything.

## 2024-04-24 ENCOUNTER — Ambulatory Visit: Payer: Self-pay | Admitting: Family Medicine

## 2024-05-15 ENCOUNTER — Other Ambulatory Visit: Payer: Self-pay | Admitting: Family Medicine

## 2024-05-15 DIAGNOSIS — F411 Generalized anxiety disorder: Secondary | ICD-10-CM

## 2024-05-27 ENCOUNTER — Other Ambulatory Visit: Payer: Self-pay | Admitting: Family Medicine

## 2024-05-27 DIAGNOSIS — I10 Essential (primary) hypertension: Secondary | ICD-10-CM

## 2024-06-25 ENCOUNTER — Ambulatory Visit (INDEPENDENT_AMBULATORY_CARE_PROVIDER_SITE_OTHER): Admitting: Family Medicine

## 2024-06-25 ENCOUNTER — Encounter: Payer: Self-pay | Admitting: Family Medicine

## 2024-06-25 VITALS — BP 132/86 | HR 61 | Temp 98.0°F | Resp 16 | Ht 69.0 in | Wt 173.2 lb

## 2024-06-25 DIAGNOSIS — F411 Generalized anxiety disorder: Secondary | ICD-10-CM | POA: Diagnosis not present

## 2024-06-25 MED ORDER — PROPRANOLOL HCL 10 MG PO TABS: ORAL_TABLET | ORAL | 1 refills | Status: AC

## 2024-06-25 NOTE — Progress Notes (Signed)
 Chief Complaint  Patient presents with   Follow-up    Follow Up    Subjective Joel Young presents for f/u anxiety  Pt is currently being treated with citalopram  10 mg daily.  Reports some improvement since treatment. Didn't really go on the express[place] so unsure if it worked.  Anxiety better but things are better in his life also.  No thoughts of harming self or others. No self-medication with alcohol, prescription drugs or illicit drugs. Pt is not following with a counselor/psychologist.  Past Medical History:  Diagnosis Date   Elevated PSA    GERD (gastroesophageal reflux disease)    High cholesterol    Hypertension    Allergies as of 06/25/2024       Reactions   Caffeine Other (See Comments)   tremors   Ciprofloxacin    Famotidine    Causes heart to race   Keflex [cephalexin] Hives        Medication List        Accurate as of June 25, 2024 10:22 AM. If you have any questions, ask your nurse or doctor.          cholecalciferol 25 MCG (1000 UNIT) tablet Commonly known as: VITAMIN D3 Take 1,000 Units by mouth daily.   citalopram  10 MG tablet Commonly known as: CELEXA  Take 1 tablet (10 mg total) by mouth daily.   finasteride  5 MG tablet Commonly known as: PROSCAR  Take 1 tablet (5 mg total) by mouth daily.   fluticasone  50 MCG/ACT nasal spray Commonly known as: FLONASE  SPRAY 2 SPRAYS INTO EACH NOSTRIL EVERY DAY   levocetirizine 5 MG tablet Commonly known as: XYZAL  TAKE 1 TABLET BY MOUTH EVERY DAY IN THE EVENING   meloxicam  15 MG tablet Commonly known as: MOBIC  Take 1 by mouth daily with food as needed for shoulder pain.   pantoprazole  40 MG tablet Commonly known as: PROTONIX  Take 1 tablet (40 mg total) by mouth 2 (two) times daily before a meal.   rosuvastatin  5 MG tablet Commonly known as: CRESTOR  Take 1 tablet (5 mg total) by mouth daily.   triamterene -hydrochlorothiazide 37.5-25 MG tablet Commonly known as: MAXZIDE-25 TAKE 1/2 TABLET  BY MOUTH DAILY        Exam BP (!) 148/80 (BP Location: Left Arm, Patient Position: Sitting)   Pulse 61   Temp 98 F (36.7 C) (Oral)   Resp 16   Ht 5' 9 (1.753 m)   Wt 173 lb 3.2 oz (78.6 kg)   SpO2 96%   BMI 25.58 kg/m  General:  well developed, well nourished, in no apparent distress Lungs:  No respiratory distress Psych: well oriented with normal range of affect and age-appropriate judgement/insight, alert and oriented x4.  Assessment and Plan  GAD (generalized anxiety disorder)  Chronic, not ideally controlled. Stop Celexa . Start propranolol  10-20 mg TID prn. Take tonight to see if it makes him drowsy, but should be well-tolerated/safe.  F/u in 6 mo. The patient voiced understanding and agreement to the plan.  [name], DO 06/25/24 10:22 AM

## 2024-06-25 NOTE — Patient Instructions (Signed)
 Take the new medicine a couple hours before bed to see if it makes your drowsy. If it does, not a good idea to take it before getting on the expressway.   Ok to stop the citalopram .   Let us  know if you need anything.

## 2024-12-31 ENCOUNTER — Ambulatory Visit: Admitting: Family Medicine

## 2025-01-07 ENCOUNTER — Ambulatory Visit: Admitting: Family Medicine
